# Patient Record
Sex: Male | Born: 1960 | Race: White | Hispanic: No | Marital: Married | Smoking: Never smoker
Health system: Southern US, Community
[De-identification: ages and names within clinical notes are randomized; demographics above are authoritative.]

## PROBLEM LIST (undated history)

## (undated) DIAGNOSIS — I10 Essential (primary) hypertension: Secondary | ICD-10-CM

## (undated) DIAGNOSIS — Z87442 Personal history of urinary calculi: Secondary | ICD-10-CM

## (undated) DIAGNOSIS — E7800 Pure hypercholesterolemia, unspecified: Secondary | ICD-10-CM

## (undated) DIAGNOSIS — E119 Type 2 diabetes mellitus without complications: Secondary | ICD-10-CM

## (undated) DIAGNOSIS — M109 Gout, unspecified: Secondary | ICD-10-CM

## (undated) DIAGNOSIS — N189 Chronic kidney disease, unspecified: Secondary | ICD-10-CM

## (undated) HISTORY — PX: PROSTATE BIOPSY: SHX241

## (undated) HISTORY — PX: COLONOSCOPY: SHX174

---
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
## 1986-05-09 HISTORY — PX: OTHER SURGICAL HISTORY: SHX169

## 2005-11-09 ENCOUNTER — Emergency Department (HOSPITAL_COMMUNITY): Admission: EM | Admit: 2005-11-09 | Discharge: 2005-11-09 | Payer: Self-pay | Admitting: Emergency Medicine

## 2014-04-01 ENCOUNTER — Encounter (HOSPITAL_COMMUNITY): Payer: Self-pay | Admitting: *Deleted

## 2014-04-01 ENCOUNTER — Emergency Department (HOSPITAL_COMMUNITY)
Admission: EM | Admit: 2014-04-01 | Discharge: 2014-04-02 | Disposition: A | Discharge status: Home / Self Care | Payer: BC Managed Care – PPO | Attending: Emergency Medicine | Admitting: Emergency Medicine

## 2014-04-01 ENCOUNTER — Emergency Department (HOSPITAL_COMMUNITY): Payer: BC Managed Care – PPO

## 2014-04-01 DIAGNOSIS — R109 Unspecified abdominal pain: Secondary | ICD-10-CM | POA: Diagnosis present

## 2014-04-01 DIAGNOSIS — N201 Calculus of ureter: Secondary | ICD-10-CM | POA: Insufficient documentation

## 2014-04-01 DIAGNOSIS — Z8739 Personal history of other diseases of the musculoskeletal system and connective tissue: Secondary | ICD-10-CM | POA: Diagnosis not present

## 2014-04-01 DIAGNOSIS — Z79899 Other long term (current) drug therapy: Secondary | ICD-10-CM | POA: Insufficient documentation

## 2014-04-01 HISTORY — DX: Gout, unspecified: M10.9

## 2014-04-01 MED ORDER — OXYCODONE-ACETAMINOPHEN 5-325 MG PO TABS: 1.0000 | ORAL_TABLET | ORAL | Status: DC | PRN

## 2014-04-01 MED ORDER — ONDANSETRON 4 MG PO TBDP: 4.0000 mg | ORAL_TABLET | Freq: Three times a day (TID) | ORAL | Status: DC | PRN

## 2014-04-01 MED ORDER — HYDROMORPHONE HCL 1 MG/ML IJ SOLN
1.0000 mg | Freq: Once | INTRAMUSCULAR | Status: AC
  Administered 2014-04-01: 1 mg via INTRAVENOUS
  Filled 2014-04-01: qty 1

## 2014-04-01 MED ORDER — KETOROLAC TROMETHAMINE 30 MG/ML IJ SOLN
30.0000 mg | Freq: Once | INTRAMUSCULAR | Status: AC
  Administered 2014-04-01: 30 mg via INTRAVENOUS
  Filled 2014-04-01: qty 1

## 2014-04-01 MED ORDER — IBUPROFEN 800 MG PO TABS: 800.0000 mg | ORAL_TABLET | Freq: Three times a day (TID) | ORAL | Status: DC

## 2014-04-01 MED ORDER — ONDANSETRON HCL 4 MG/2ML IJ SOLN
4.0000 mg | Freq: Once | INTRAMUSCULAR | Status: AC
  Administered 2014-04-01: 4 mg via INTRAVENOUS
  Filled 2014-04-01: qty 2

## 2014-04-01 MED ORDER — PROMETHAZINE HCL 25 MG PO TABS
25.0000 mg | ORAL_TABLET | Freq: Four times a day (QID) | ORAL | Status: DC | PRN
Start: 2014-04-01 — End: 2023-03-09

## 2014-04-01 MED ORDER — TAMSULOSIN HCL 0.4 MG PO CAPS: 0.4000 mg | ORAL_CAPSULE | Freq: Two times a day (BID) | ORAL | Status: DC

## 2014-04-01 NOTE — ED Provider Notes (Signed)
CSN: [id number]     Arrival date & time 04/01/14  2210 History   First MD Initiated Contact with Patient 04/01/14 2236     Chief Complaint  Patient presents with  . Flank Pain     (Consider location/radiation/quality/duration/timing/severity/associated sxs/prior Treatment) HPI Comments: The patient is a 53 year old male, he has no significant past medical history, who presents after having acute onset of pain in his right flank which occurred at approximately 7:00 this evening. This pain has been rather persistent that there is some fluctuation in the intensity of pain, it is regardless of the patient's position, he has no associated nausea or vomiting or diaphoresis and the pain does not radiate. The pain is a sharp and stabbing pain which was acute in onset. He denies a history of kidney stones and has had no recent dysuria, hematuria, fevers or chills. He took a Flexeril prior to arrival with no relief.  Patient is a 53 y.o. male presenting with flank pain. The history is provided by the patient and the spouse.  Flank Pain    Past Medical History  Diagnosis Date  . Gout    Past Surgical History  Procedure Laterality Date  . Tonsillectomy     No family history on file. History  Substance Use Topics  . Smoking status: Never Smoker   . Smokeless tobacco: Not on file  . Alcohol Use: No    Review of Systems  Genitourinary: Positive for flank pain.  All other systems reviewed and are negative.     Allergies  Review of patient's allergies indicates no known allergies.  Home Medications   Prior to Admission medications   Medication Sig Start Date End Date Taking? Authorizing Provider  ibuprofen (ADVIL,MOTRIN) 800 MG tablet Take 1 tablet (800 mg total) by mouth 3 (three) times daily. 04/01/14   Vida Roller, MD  ondansetron (ZOFRAN ODT) 4 MG disintegrating tablet Take 1 tablet (4 mg total) by mouth every 8 (eight) hours as needed for nausea. 04/01/14   Vida Roller, MD   oxyCODONE-acetaminophen (PERCOCET) 5-325 MG per tablet Take 1 tablet by mouth every 4 (four) hours as needed. 04/01/14   Vida Roller, MD  promethazine (PHENERGAN) 25 MG tablet Take 1 tablet (25 mg total) by mouth every 6 (six) hours as needed for nausea or vomiting. 04/01/14   Vida Roller, MD  tamsulosin (FLOMAX) 0.4 MG CAPS capsule Take 1 capsule (0.4 mg total) by mouth 2 (two) times daily. 04/01/14   Vida Roller, MD   BP 157/78 mmHg  Pulse 61  Temp(Src) 98 F (36.7 C) (Oral)  Resp 22  Ht 6\' 1"  (1.854 m)  Wt 235 lb (106.595 kg)  BMI 31.01 kg/m2  SpO2 96% Physical Exam  Constitutional: He appears well-developed and well-nourished. No distress.  HENT:  Head: Normocephalic and atraumatic.  Mouth/Throat: Oropharynx is clear and moist. No oropharyngeal exudate.  Eyes: Conjunctivae and EOM are normal. Pupils are equal, round, and reactive to light. Right eye exhibits no discharge. Left eye exhibits no discharge. No scleral icterus.  Neck: Normal range of motion. Neck supple. No JVD present. No thyromegaly present.  Cardiovascular: Normal rate, regular rhythm, normal heart sounds and intact distal pulses.  Exam reveals no gallop and no friction rub.   No murmur heard. Pulmonary/Chest: Effort normal and breath sounds normal. No respiratory distress. He has no wheezes. He has no rales.  Abdominal: Soft. Bowel sounds are normal. He exhibits no distension and no mass. There  is no tenderness.  R CVA ttp  Musculoskeletal: Normal range of motion. He exhibits no edema or tenderness.  Lymphadenopathy:    He has no cervical adenopathy.  Neurological: He is alert. Coordination normal.  Skin: Skin is warm and dry. No rash noted. No erythema.  Psychiatric: He has a normal mood and affect. His behavior is normal.  Nursing note and vitals reviewed.   ED Course  Procedures (including critical care time) Labs Review Labs Reviewed  URINALYSIS, ROUTINE W REFLEX MICROSCOPIC    Imaging  Review Ct Abdomen Pelvis Wo Contrast  04/01/2014   CLINICAL DATA:  Acute onset LEFT flank pain beginning today.  EXAM: CT ABDOMEN AND PELVIS WITHOUT CONTRAST  TECHNIQUE: Multidetector CT imaging of the abdomen and pelvis was performed following the standard protocol without IV contrast.  COMPARISON:  None.  FINDINGS: LUNG BASES: Included view of the lung bases are clear. The visualized heart and pericardium are unremarkable.  KIDNEYS/BLADDER: Kidneys are orthotopic, demonstrating normal size and morphology. Mild RIGHT hydroureteronephrosis to the level of the proximal ureter where a 5 mm calculus is seen. Residual 3 mm RIGHT lower pole, 4 mm RIGHT lower pole, 3 mm RIGHT lower pole nephrolithiasis. Punctate LEFT lower pole nephrolithiasis without hydronephrosis. The urinary bladder is partially distended, harboring no intravesicular calculi.  SOLID ORGANS: The spleen, gallbladder, pancreas and adrenal glands are unremarkable for this non-contrast examination. Mild hepatic steatosis.  GASTROINTESTINAL TRACT: The stomach, small and large bowel are normal in course and caliber without inflammatory changes, the sensitivity may be decreased by lack of enteric contrast. Mild sigmoid diverticulosis. Subcentimeter focal outpouching at the cecum may reflect diverticulum or appendix without inflammation.  PERITONEUM/RETROPERITONEUM: No intraperitoneal free fluid nor free air. Aortoiliac vessels are normal in course and caliber, mild calcific atherosclerosis. No lymphadenopathy by CT size criteria. Prostate is 5.3 x 4.4 cm, deforming the base of the urinary bladder.  SOFT TISSUES/ OSSEOUS STRUCTURES: Nonsuspicious. Moderate sacroiliac osteoarthrosis. Moderate bilateral fat containing inguinal hernias.  IMPRESSION: Mild RIGHT hydroureteronephrosis to the level the proximal ureter where a 5 mm calculus is seen.  Bilateral nephrolithiasis measuring up to 4 mm. No LEFT obstructive uropathy.   Electronically Signed   By: Awilda Metroourtnay   Bloomer   On: 04/01/2014 23:41      MDM   Final diagnoses:  Flank pain  Ureterolithiasis    On exam the patient has CVA tenderness, on bedside ultrasound he has mild hydronephrosis, we'll proceed with CT scan to evaluate size of the kidney stone. Medications will be given as below.  Emergency Focused Ultrasound Exam Limited retroperitoneal ultrasound of kidneys  Performed and interpreted by Dr. Hyacinth MeekerMiller Indication: flank pain Focused abdominal ultrasound with both kidneys imaged in transverse and longitudinal planes in real-time. Interpretation:  hydronephrosis visualized on the R Images archived electronically  The CT scan shows that the pt has a 5.5 mm KS, strainer given, pain meds with significant improvement, VS stable - pt instructed on indications for return, expressed understanding.   Meds given in ED:  Medications  ketorolac (TORADOL) 30 MG/ML injection 30 mg (30 mg Intravenous Given 04/01/14 2253)  HYDROmorphone (DILAUDID) injection 1 mg (1 mg Intravenous Given 04/01/14 2301)  ondansetron (ZOFRAN) injection 4 mg (4 mg Intravenous Given 04/01/14 2253)    New Prescriptions   IBUPROFEN (ADVIL,MOTRIN) 800 MG TABLET    Take 1 tablet (800 mg total) by mouth 3 (three) times daily.   ONDANSETRON (ZOFRAN ODT) 4 MG DISINTEGRATING TABLET    Take 1 tablet (4  mg total) by mouth every 8 (eight) hours as needed for nausea.   OXYCODONE-ACETAMINOPHEN (PERCOCET) 5-325 MG PER TABLET    Take 1 tablet by mouth every 4 (four) hours as needed.   PROMETHAZINE (PHENERGAN) 25 MG TABLET    Take 1 tablet (25 mg total) by mouth every 6 (six) hours as needed for nausea or vomiting.   TAMSULOSIN (FLOMAX) 0.4 MG CAPS CAPSULE    Take 1 capsule (0.4 mg total) by mouth 2 (two) times daily.      Vida RollerBrian D Harrol Novello, MD 04/02/14 0000

## 2014-04-01 NOTE — ED Notes (Signed)
Pt reports R flank pain  With n/v since 1900.  No hx of kidney stones

## 2014-04-01 NOTE — ED Notes (Signed)
Patient unable to urinate at this time. 

## 2014-04-02 LAB — URINALYSIS, ROUTINE W REFLEX MICROSCOPIC
BILIRUBIN URINE: NEGATIVE
GLUCOSE, UA: NEGATIVE mg/dL
KETONES UR: NEGATIVE mg/dL
Leukocytes, UA: NEGATIVE
NITRITE: NEGATIVE
PH: 7 (ref 5.0–8.0)
Protein, ur: 30 mg/dL — AB
SPECIFIC GRAVITY, URINE: 1.019 (ref 1.005–1.030)
Urobilinogen, UA: 0.2 mg/dL (ref 0.0–1.0)

## 2014-04-02 LAB — URINE MICROSCOPIC-ADD ON

## 2014-04-08 DIAGNOSIS — Z87442 Personal history of urinary calculi: Secondary | ICD-10-CM

## 2014-04-08 HISTORY — DX: Personal history of urinary calculi: Z87.442

## 2014-04-25 ENCOUNTER — Other Ambulatory Visit: Payer: Self-pay | Admitting: Urology

## 2014-04-28 ENCOUNTER — Encounter (HOSPITAL_COMMUNITY): Payer: Self-pay | Admitting: *Deleted

## 2014-05-01 ENCOUNTER — Encounter (HOSPITAL_COMMUNITY): Payer: Self-pay | Admitting: *Deleted

## 2014-05-01 ENCOUNTER — Encounter (HOSPITAL_COMMUNITY)
Admission: RE | Disposition: A | Discharge status: Home / Self Care | Payer: Self-pay | Source: Ambulatory Visit | Attending: Urology

## 2014-05-01 ENCOUNTER — Ambulatory Visit (HOSPITAL_COMMUNITY): Payer: BC Managed Care – PPO

## 2014-05-01 ENCOUNTER — Ambulatory Visit (HOSPITAL_COMMUNITY)
Admission: RE | Admit: 2014-05-01 | Discharge: 2014-05-01 | Disposition: A | Discharge status: Home / Self Care | Payer: BC Managed Care – PPO | Source: Ambulatory Visit | Attending: Urology | Admitting: Urology

## 2014-05-01 DIAGNOSIS — N201 Calculus of ureter: Secondary | ICD-10-CM | POA: Diagnosis not present

## 2014-05-01 DIAGNOSIS — E780 Pure hypercholesterolemia: Secondary | ICD-10-CM | POA: Diagnosis not present

## 2014-05-01 DIAGNOSIS — J45909 Unspecified asthma, uncomplicated: Secondary | ICD-10-CM | POA: Diagnosis not present

## 2014-05-01 DIAGNOSIS — M109 Gout, unspecified: Secondary | ICD-10-CM | POA: Diagnosis not present

## 2014-05-01 DIAGNOSIS — I10 Essential (primary) hypertension: Secondary | ICD-10-CM | POA: Insufficient documentation

## 2014-05-01 HISTORY — DX: Personal history of urinary calculi: Z87.442

## 2014-05-01 SURGERY — LITHOTRIPSY, ESWL
Anesthesia: LOCAL | Laterality: Right

## 2014-05-01 MED ORDER — DIPHENHYDRAMINE HCL 25 MG PO CAPS
25.0000 mg | ORAL_CAPSULE | ORAL | Status: AC
  Administered 2014-05-01: 25 mg via ORAL
  Filled 2014-05-01: qty 1

## 2014-05-01 MED ORDER — DEXTROSE-NACL 5-0.45 % IV SOLN
INTRAVENOUS | Status: DC
  Administered 2014-05-01: 1000 mL via INTRAVENOUS

## 2014-05-01 MED ORDER — DIAZEPAM 5 MG PO TABS
10.0000 mg | ORAL_TABLET | ORAL | Status: AC
  Administered 2014-05-01: 10 mg via ORAL
  Filled 2014-05-01: qty 2

## 2014-05-01 MED ORDER — CIPROFLOXACIN HCL 500 MG PO TABS
500.0000 mg | ORAL_TABLET | ORAL | Status: AC
  Administered 2014-05-01: 500 mg via ORAL
  Filled 2014-05-01: qty 1

## 2014-05-01 NOTE — Op Note (Signed)
Refer to [facility] Op Note scanned in the chart 

## 2014-05-01 NOTE — H&P (Signed)
History of Present Illness Sergio Davis returns today for follow-up management of kidney stone. He had moderate to severe pain on and off till last Saturday. He has not had any since. He has not passed a stone either. KUB today shows the stone in the same location at the transverse process of L4.   Past Medical History Problems  1. History of Asthma (J45.909) 2. History of gout (Z87.39) 3. History of hypercholesterolemia (Z86.39) 4. History of hypertension (Z86.79)  Surgical History Problems  1. History of Surgery Testis Incision 2. History of Tonsillectomy  Current Meds 1. Ibuprofen 800 MG Oral Tablet;  Therapy: (Recorded:01Dec2015) to Recorded 2. Oxycodone-Acetaminophen 5-325 MG Oral Tablet;  Therapy: (Recorded:01Dec2015) to Recorded 3. Tamsulosin HCl - 0.4 MG Oral Capsule; TAKE 1 CAPSULE Bedtime;  Therapy: 01Dec2015 to (Last Rx:01Dec2015) Ordered 4. Tamsulosin HCl - 0.4 MG Oral Capsule;  Therapy: (Recorded:01Dec2015) to Recorded  Allergies Medication  1. No Known Drug Allergies  Family History Problems  1. Family history of Colon Cancer 2. Family history of Dementia : Father 3. Family history of Family Health Status - Father's Age : Mother   47 4. Family history of Family Health Status - Mother's Age : Mother   67 5. Family history of Family Health Status Number Of Children : Mother   1 son / 2 daug -Adopted  Social History Problems  1. Denied: History of Alcohol Use 2. Caffeine Use   4 to 6 cups coffee a day 3. Marital History - Currently Married 4. Never a smoker 5. Denied: History of Tobacco Use  Review of Systems Genitourinary, constitutional, skin, eye, otolaryngeal, hematologic/lymphatic, cardiovascular, pulmonary, endocrine, musculoskeletal, gastrointestinal, neurological and psychiatric system(s) were reviewed and pertinent findings if present are noted and are otherwise negative.  Genitourinary: urinary frequency, feelings of urinary urgency, nocturia  and weak urinary stream.  Gastrointestinal: nausea, vomiting, flank pain, heartburn and constipation.  Constitutional: feeling tired (fatigue).    Physical Exam Constitutional: Well nourished and well developed . No acute distress.  ENT:. The ears and nose are normal in appearance.  Neck: The appearance of the neck is normal and no neck mass is present.  Pulmonary: No respiratory distress and normal respiratory rhythm and effort.  Cardiovascular: Heart rate and rhythm are normal . No peripheral edema.  Abdomen: The abdomen is soft and nontender. No masses are palpated. No CVA tenderness. No hernias are palpable. No hepatosplenomegaly noted.  Genitourinary: Examination of the penis demonstrates no discharge, no masses, no lesions and a normal meatus. The scrotum is without lesions. The right epididymis is palpably normal and non-tender. The left epididymis is palpably normal and non-tender. The right testis is non-tender and without masses. The left testis is non-tender and without masses.  Lymphatics: The femoral and inguinal nodes are not enlarged or tender.  Skin: Normal skin turgor, no visible rash and no visible skin lesions.  Neuro/Psych:. Mood and affect are appropriate.    Results/Data Urine [Data Includes: Last 1 Day]   17Dec2015  COLOR YELLOW   APPEARANCE CLOUDY   SPECIFIC GRAVITY 1.020   pH 7.0   GLUCOSE NEG mg/dL  BILIRUBIN NEG   KETONE NEG mg/dL  BLOOD SMALL   PROTEIN NEG mg/dL  UROBILINOGEN 0.2 mg/dL  NITRITE NEG   LEUKOCYTE ESTERASE NEG   SQUAMOUS EPITHELIAL/HPF NONE SEEN   WBC NONE SEEN WBC/hpf  RBC NONE SEEN RBC/hpf  BACTERIA NONE SEEN   CRYSTALS NONE SEEN   CASTS NONE SEEN   Other MODERATE AMORPHOUS MATERIAL  KUB  INDICATION: Follow-up right ureteral stone  KUB shows normal bowel gas pattern. Psoas shadows are normal. There is a 5 mm calcification at the transverse process of L4. It has not changed in position since X Ray of 2 weeks ago.   IMPRESSION:  Right proximal ureteral calculus. Unchanged since last KUB.   Assessment Assessed  1. Calculus of right ureter (N20.1)  Plan Calculus of right ureter  1. KUB; Status:Resulted - Requires Verification;   Done: 17Dec2015 12:00AM Health Maintenance  2. UA With REFLEX; [Do Not Release]; Status:Resulted - Requires Verification;   Done:  17Dec2015 02:58PM  Treatment options were discussed with the patient and his wife. They are: ESL versus ureteroscopy with holmium laser. The risks, benefits of each option were reviewed. The risks of ureteroscopy include but are not limited to hemorrhage, infection, ureteral injury, inability to find the stone.  The risks of ESL are: hemorrhage, renal or perirenal hematoma, injury to adjacent organs, inability to fragment the stone, steinstrasse. They understand and wish to proceed with ESL.  History of Present Illness Sergio Davis returns today for follow-up management of kidney stone. He had moderate to severe pain on and off till last Saturday. He has not had any since. He has not passed a stone either. KUB today shows the stone in the same location at the transverse process of L4.   Past Medical History Problems  1. History of Asthma (J45.909) 2. History of gout (Z87.39) 3. History of hypercholesterolemia (Z86.39) 4. History of hypertension (Z86.79)  Surgical History Problems  1. History of Surgery Testis Incision 2. History of Tonsillectomy  Current Meds 1. Ibuprofen 800 MG Oral Tablet;  Therapy: (Recorded:01Dec2015) to Recorded 2. Oxycodone-Acetaminophen 5-325 MG Oral Tablet;  Therapy: (Recorded:01Dec2015) to Recorded 3. Tamsulosin HCl - 0.4 MG Oral Capsule; TAKE 1 CAPSULE Bedtime;  Therapy: 01Dec2015 to (Last Rx:01Dec2015) Ordered 4. Tamsulosin HCl - 0.4 MG Oral Capsule;  Therapy: (Recorded:01Dec2015) to Recorded  Allergies Medication  1. No Known Drug Allergies  Family History Problems  1. Family history of Colon Cancer 2. Family history  of Dementia : Father 3. Family history of Family Health Status - Father's Age : Mother   33 4. Family history of Family Health Status - Mother's Age : Mother   64 5. Family history of Family Health Status Number Of Children : Mother   1 son / 2 daug -Adopted  Social History Problems  1. Denied: History of Alcohol Use 2. Caffeine Use   4 to 6 cups coffee a day 3. Marital History - Currently Married 4. Never a smoker 5. Denied: History of Tobacco Use  Review of Systems Genitourinary, constitutional, skin, eye, otolaryngeal, hematologic/lymphatic, cardiovascular, pulmonary, endocrine, musculoskeletal, gastrointestinal, neurological and psychiatric system(s) were reviewed and pertinent findings if present are noted and are otherwise negative.  Genitourinary: urinary frequency, feelings of urinary urgency, nocturia and weak urinary stream.  Gastrointestinal: nausea, vomiting, flank pain, heartburn and constipation.  Constitutional: feeling tired (fatigue).    Physical Exam Constitutional: Well nourished and well developed . No acute distress.  ENT:. The ears and nose are normal in appearance.  Neck: The appearance of the neck is normal and no neck mass is present.  Pulmonary: No respiratory distress and normal respiratory rhythm and effort.  Cardiovascular: Heart rate and rhythm are normal . No peripheral edema.  Abdomen: The abdomen is soft and nontender. No masses are palpated. No CVA tenderness. No hernias are palpable. No hepatosplenomegaly noted.  Genitourinary: Examination of the penis  demonstrates no discharge, no masses, no lesions and a normal meatus. The scrotum is without lesions. The right epididymis is palpably normal and non-tender. The left epididymis is palpably normal and non-tender. The right testis is non-tender and without masses. The left testis is non-tender and without masses.  Lymphatics: The femoral and inguinal nodes are not enlarged or tender.  Skin: Normal  skin turgor, no visible rash and no visible skin lesions.  Neuro/Psych:. Mood and affect are appropriate.    Results/Data Urine [Data Includes: Last 1 Day]   17Dec2015  COLOR YELLOW   APPEARANCE CLOUDY   SPECIFIC GRAVITY 1.020   pH 7.0   GLUCOSE NEG mg/dL  BILIRUBIN NEG   KETONE NEG mg/dL  BLOOD SMALL   PROTEIN NEG mg/dL  UROBILINOGEN 0.2 mg/dL  NITRITE NEG   LEUKOCYTE ESTERASE NEG   SQUAMOUS EPITHELIAL/HPF NONE SEEN   WBC NONE SEEN WBC/hpf  RBC NONE SEEN RBC/hpf  BACTERIA NONE SEEN   CRYSTALS NONE SEEN   CASTS NONE SEEN   Other MODERATE AMORPHOUS MATERIAL    KUB  INDICATION: Follow-up right ureteral stone  KUB shows normal bowel gas pattern. Psoas shadows are normal. There is a 5 mm calcification at the transverse process of L4. It has not changed in position since X Ray of 2 weeks ago.   IMPRESSION: Right proximal ureteral calculus. Unchanged since last KUB.   Assessment Assessed  1. Calculus of right ureter (N20.1)  Plan Calculus of right ureter  1. KUB; Status:Resulted - Requires Verification;   Done: 17Dec2015 12:00AM Health Maintenance  2. UA With REFLEX; [Do Not Release]; Status:Resulted - Requires Verification;   Done:  17Dec2015 02:58PM  Treatment options were discussed with the patient and his wife. They are: ESL versus ureteroscopy with holmium laser. The risks, benefits of each option were reviewed. The risks of ureteroscopy include but are not limited to hemorrhage, infection, ureteral injury, inability to find the stone.  The risks of ESL are: hemorrhage, renal or perirenal hematoma, injury to adjacent organs, inability to fragment the stone, steinstrasse. They understand and wish to proceed with ESL.  History of Present Illness Sergio Davis returns today for follow-up management of kidney stone. He had moderate to severe pain on and off till last Saturday. He has not had any since. He has not passed a stone either. KUB today shows the stone in the same  location at the transverse process of L4.   Past Medical History Problems  1. History of Asthma (J45.909) 2. History of gout (Z87.39) 3. History of hypercholesterolemia (Z86.39) 4. History of hypertension (Z86.79)  Surgical History Problems  1. History of Surgery Testis Incision 2. History of Tonsillectomy  Current Meds 1. Ibuprofen 800 MG Oral Tablet;  Therapy: (Recorded:01Dec2015) to Recorded 2. Oxycodone-Acetaminophen 5-325 MG Oral Tablet;  Therapy: (Recorded:01Dec2015) to Recorded 3. Tamsulosin HCl - 0.4 MG Oral Capsule; TAKE 1 CAPSULE Bedtime;  Therapy: 01Dec2015 to (Last Rx:01Dec2015) Ordered 4. Tamsulosin HCl - 0.4 MG Oral Capsule;  Therapy: (Recorded:01Dec2015) to Recorded  Allergies Medication  1. No Known Drug Allergies  Family History Problems  1. Family history of Colon Cancer 2. Family history of Dementia : Father 3. Family history of Family Health Status - Father's Age : Mother   6179 4. Family history of Family Health Status - Mother's Age : Mother   3777 5. Family history of Family Health Status Number Of Children : Mother   1 son / 2 daug -Adopted  Social History Problems  1. Denied:  History of Alcohol Use 2. Caffeine Use   4 to 6 cups coffee a day 3. Marital History - Currently Married 4. Never a smoker 5. Denied: History of Tobacco Use  Review of Systems Genitourinary, constitutional, skin, eye, otolaryngeal, hematologic/lymphatic, cardiovascular, pulmonary, endocrine, musculoskeletal, gastrointestinal, neurological and psychiatric system(s) were reviewed and pertinent findings if present are noted and are otherwise negative.  Genitourinary: urinary frequency, feelings of urinary urgency, nocturia and weak urinary stream.  Gastrointestinal: nausea, vomiting, flank pain, heartburn and constipation.  Constitutional: feeling tired (fatigue).    Physical Exam Constitutional: Well nourished and well developed . No acute distress.  ENT:. The ears  and nose are normal in appearance.  Neck: The appearance of the neck is normal and no neck mass is present.  Pulmonary: No respiratory distress and normal respiratory rhythm and effort.  Cardiovascular: Heart rate and rhythm are normal . No peripheral edema.  Abdomen: The abdomen is soft and nontender. No masses are palpated. No CVA tenderness. No hernias are palpable. No hepatosplenomegaly noted.  Genitourinary: Examination of the penis demonstrates no discharge, no masses, no lesions and a normal meatus. The scrotum is without lesions. The right epididymis is palpably normal and non-tender. The left epididymis is palpably normal and non-tender. The right testis is non-tender and without masses. The left testis is non-tender and without masses.  Lymphatics: The femoral and inguinal nodes are not enlarged or tender.  Skin: Normal skin turgor, no visible rash and no visible skin lesions.  Neuro/Psych:. Mood and affect are appropriate.    Results/Data Urine [Data Includes: Last 1 Day]   17Dec2015  COLOR YELLOW   APPEARANCE CLOUDY   SPECIFIC GRAVITY 1.020   pH 7.0   GLUCOSE NEG mg/dL  BILIRUBIN NEG   KETONE NEG mg/dL  BLOOD SMALL   PROTEIN NEG mg/dL  UROBILINOGEN 0.2 mg/dL  NITRITE NEG   LEUKOCYTE ESTERASE NEG   SQUAMOUS EPITHELIAL/HPF NONE SEEN   WBC NONE SEEN WBC/hpf  RBC NONE SEEN RBC/hpf  BACTERIA NONE SEEN   CRYSTALS NONE SEEN   CASTS NONE SEEN   Other MODERATE AMORPHOUS MATERIAL    KUB  INDICATION: Follow-up right ureteral stone  KUB shows normal bowel gas pattern. Psoas shadows are normal. There is a 5 mm calcification at the transverse process of L4. It has not changed in position since X Ray of 2 weeks ago.   IMPRESSION: Right proximal ureteral calculus. Unchanged since last KUB.   Assessment Assessed  1. Calculus of right ureter (N20.1)  Plan Calculus of right ureter  1. KUB; Status:Resulted - Requires Verification;   Done: 17Dec2015 12:00AM Health Maintenance   2. UA With REFLEX; [Do Not Release]; Status:Resulted - Requires Verification;   Done:  17Dec2015 02:58PM  Treatment options were discussed with the patient and his wife. They are: ESL versus ureteroscopy with holmium laser. The risks, benefits of each option were reviewed. The risks of ureteroscopy include but are not limited to hemorrhage, infection, ureteral injury, inability to find the stone.  The risks of ESL are: hemorrhage, renal or perirenal hematoma, injury to adjacent organs, inability to fragment the stone, steinstrasse. They understand and wish to proceed with ESL.  History of Present Illness Sergio Davis returns today for follow-up management of kidney stone. He had moderate to severe pain on and off till last Saturday. He has not had any since. He has not passed a stone either. KUB today shows the stone in the same location at the transverse process of L4.  Past Medical History Problems  1. History of Asthma (J45.909) 2. History of gout (Z87.39) 3. History of hypercholesterolemia (Z86.39) 4. History of hypertension (Z86.79)  Surgical History Problems  1. History of Surgery Testis Incision 2. History of Tonsillectomy  Current Meds 1. Ibuprofen 800 MG Oral Tablet;  Therapy: (Recorded:01Dec2015) to Recorded 2. Oxycodone-Acetaminophen 5-325 MG Oral Tablet;  Therapy: (Recorded:01Dec2015) to Recorded 3. Tamsulosin HCl - 0.4 MG Oral Capsule; TAKE 1 CAPSULE Bedtime;  Therapy: 01Dec2015 to (Last Rx:01Dec2015) Ordered 4. Tamsulosin HCl - 0.4 MG Oral Capsule;  Therapy: (Recorded:01Dec2015) to Recorded  Allergies Medication  1. No Known Drug Allergies  Family History Problems  1. Family history of Colon Cancer 2. Family history of Dementia : Father 3. Family history of Family Health Status - Father's Age : Mother   4779 4. Family history of Family Health Status - Mother's Age : Mother   2977 5. Family history of Family Health Status Number Of Children : Mother   1 son / 2  daug -Adopted  Social History Problems  1. Denied: History of Alcohol Use 2. Caffeine Use   4 to 6 cups coffee a day 3. Marital History - Currently Married 4. Never a smoker 5. Denied: History of Tobacco Use  Review of Systems Genitourinary, constitutional, skin, eye, otolaryngeal, hematologic/lymphatic, cardiovascular, pulmonary, endocrine, musculoskeletal, gastrointestinal, neurological and psychiatric system(s) were reviewed and pertinent findings if present are noted and are otherwise negative.  Genitourinary: urinary frequency, feelings of urinary urgency, nocturia and weak urinary stream.  Gastrointestinal: nausea, vomiting, flank pain, heartburn and constipation.  Constitutional: feeling tired (fatigue).    Physical Exam Constitutional: Well nourished and well developed . No acute distress.  ENT:. The ears and nose are normal in appearance.  Neck: The appearance of the neck is normal and no neck mass is present.  Pulmonary: No respiratory distress and normal respiratory rhythm and effort.  Cardiovascular: Heart rate and rhythm are normal . No peripheral edema.  Abdomen: The abdomen is soft and nontender. No masses are palpated. No CVA tenderness. No hernias are palpable. No hepatosplenomegaly noted.  Genitourinary: Examination of the penis demonstrates no discharge, no masses, no lesions and a normal meatus. The scrotum is without lesions. The right epididymis is palpably normal and non-tender. The left epididymis is palpably normal and non-tender. The right testis is non-tender and without masses. The left testis is non-tender and without masses.  Lymphatics: The femoral and inguinal nodes are not enlarged or tender.  Skin: Normal skin turgor, no visible rash and no visible skin lesions.  Neuro/Psych:. Mood and affect are appropriate.    Results/Data Urine [Data Includes: Last 1 Day]   17Dec2015  COLOR YELLOW   APPEARANCE CLOUDY   SPECIFIC GRAVITY 1.020   pH 7.0   GLUCOSE  NEG mg/dL  BILIRUBIN NEG   KETONE NEG mg/dL  BLOOD SMALL   PROTEIN NEG mg/dL  UROBILINOGEN 0.2 mg/dL  NITRITE NEG   LEUKOCYTE ESTERASE NEG   SQUAMOUS EPITHELIAL/HPF NONE SEEN   WBC NONE SEEN WBC/hpf  RBC NONE SEEN RBC/hpf  BACTERIA NONE SEEN   CRYSTALS NONE SEEN   CASTS NONE SEEN   Other MODERATE AMORPHOUS MATERIAL    KUB  INDICATION: Follow-up right ureteral stone  KUB shows normal bowel gas pattern. Psoas shadows are normal. There is a 5 mm calcification at the transverse process of L4. It has not changed in position since X Ray of 2 weeks ago.   IMPRESSION: Right proximal ureteral calculus. Unchanged since last  KUB.   Assessment Assessed  1. Calculus of right ureter (N20.1)  Plan Calculus of right ureter  1. KUB; Status:Resulted - Requires Verification;   Done: 17Dec2015 12:00AM Health Maintenance  2. UA With REFLEX; [Do Not Release]; Status:Resulted - Requires Verification;   Done:  17Dec2015 02:58PM  Treatment options were discussed with the patient and his wife. They are: ESL versus ureteroscopy with holmium laser. The risks, benefits of each option were reviewed. The risks of ureteroscopy include but are not limited to hemorrhage, infection, ureteral injury, inability to find the stone.  The risks of ESL are: hemorrhage, renal or perirenal hematoma, injury to adjacent organs, inability to fragment the stone, steinstrasse. They understand and wish to proceed with ESL.

## 2014-05-01 NOTE — Discharge Instructions (Signed)
Lithotripsy, Care After °Refer to this sheet in the next few weeks. These instructions provide you with information on caring for yourself after your procedure. Your health care provider may also give you more specific instructions. Your treatment has been planned according to current medical practices, but problems sometimes occur. Call your health care provider if you have any problems or questions after your procedure. °WHAT TO EXPECT AFTER THE PROCEDURE  °· Your urine may have a red tinge for a few days after treatment. Blood loss is usually minimal. °· You may have soreness in the back or flank area. This usually goes away after a few days. The procedure can cause blotches or bruises on the back where the pressure wave enters the skin. These marks usually cause only minimal discomfort and should disappear in a short time. °· Stone fragments should begin to pass within 24 hours of treatment. However, a delayed passage is not unusual. °· You may have pain, discomfort, and feel sick to your stomach (nauseated) when the crushed fragments of stone are passed down the tube from the kidney to the bladder. Stone fragments can pass soon after the procedure and may last for up to 4-8 weeks. °· A small number of patients may have severe pain when stone fragments are not able to pass, which leads to an obstruction. °· If your stone is greater than 1 inch (2.5 cm) in diameter or if you have multiple stones that have a combined diameter greater than 1 inch (2.5 cm), you may require more than one treatment. °· If you had a stent placed prior to your procedure, you may experience some discomfort, especially during urination. You may experience the pain or discomfort in your flank or back, or you may experience a sharp pain or discomfort at the base of your penis or in your lower abdomen. The discomfort usually lasts only a few minutes after urinating. °HOME CARE INSTRUCTIONS  °· Rest at home until you feel your energy  improving. °· Only take over-the-counter or prescription medicines for pain, discomfort, or fever as directed by your health care provider. Depending on the type of lithotripsy, you may need to take antibiotics and anti-inflammatory medicines for a few days. °· Drink enough water and fluids to keep your urine clear or pale yellow. This helps "flush" your kidneys. It helps pass any remaining pieces of stone and prevents stones from coming back. °· Most people can resume daily activities within 1-2 days after standard lithotripsy. It can take longer to recover from laser and percutaneous lithotripsy. °· If the stones are in your urinary system, you may be asked to strain your urine at home to look for stones. Any stones that are found can be sent to a medical lab for examination. °· Visit your health care provider for a follow-up appointment in a few weeks. Your doctor may remove your stent if you have one. Your health care provider will also check to see whether stone particles still remain. °SEEK MEDICAL CARE IF:  °· Your pain is not relieved by medicine. °· You have a lasting nauseous feeling. °· You feel there is too much blood in the urine. °· You develop persistent problems with frequent or painful urination that does not at least partially improve after 2 days following the procedure. °· You have a congested cough. °· You feel lightheaded. °· You develop a rash or any other signs that might suggest an allergic problem. °· You develop any reaction or side effects to   your medicine(s). °SEEK IMMEDIATE MEDICAL CARE IF:  °· You experience severe back or flank pain or both. °· You see nothing but blood when you urinate. °· You cannot pass any urine at all. °· You have a fever or shaking chills. °· You develop shortness of breath, difficulty breathing, or chest pain. °· You develop vomiting that will not stop after 6-8 hours. °· You have a fainting episode. °Document Released: 05/15/2007 Document Revised: 02/13/2013  Document Reviewed: 11/08/2012 °ExitCare® Patient Information ©2015 [employer]. This information is not intended to replace advice given to you by your health care provider. Make sure you discuss any questions you have with your health care provider. ° ° ° °Conscious Sedation, Adult, Care After °Refer to this sheet in the next few weeks. These instructions provide you with information on caring for yourself after your procedure. Your health care provider may also give you more specific instructions. Your treatment has been planned according to current medical practices, but problems sometimes occur. Call your health care provider if you have any problems or questions after your procedure. °WHAT TO EXPECT AFTER THE PROCEDURE  °After your procedure: °· You may feel sleepy, clumsy, and have poor balance for several hours. °· Vomiting may occur if you eat too soon after the procedure. °HOME CARE INSTRUCTIONS °· Do not participate in any activities where you could become injured for at least 24 hours. Do not: °¨ Drive. °¨ Swim. °¨ Ride a bicycle. °¨ Operate heavy machinery. °¨ Cook. °¨ Use power tools. °¨ Climb ladders. °¨ Work from a high place. °· Do not make important decisions or sign legal documents until you are improved. °· If you vomit, drink water, juice, or soup when you can drink without vomiting. Make sure you have little or no nausea before eating solid foods. °· Only take over-the-counter or prescription medicines for pain, discomfort, or fever as directed by your health care provider. °· Make sure you and your family fully understand everything about the medicines given to you, including what side effects may occur. °· You should not drink alcohol, take sleeping pills, or take medicines that cause drowsiness for at least 24 hours. °· If you smoke, do not smoke without supervision. °· If you are feeling better, you may resume normal activities 24 hours after you were sedated. °· Keep all appointments with  your health care provider. °SEEK MEDICAL CARE IF: °· Your skin is pale or bluish in color. °· You continue to feel nauseous or vomit. °· Your pain is getting worse and is not helped by medicine. °· You have bleeding or swelling. °· You are still sleepy or feeling clumsy after 24 hours. °SEEK IMMEDIATE MEDICAL CARE IF: °· You develop a rash. °· You have difficulty breathing. °· You develop any type of allergic problem. °· You have a fever. °MAKE SURE YOU: °· Understand these instructions. °· Will watch your condition. °· Will get help right away if you are not doing well or get worse. °Document Released: 02/13/2013 Document Reviewed: 02/13/2013 °ExitCare® Patient Information ©2015 [employer]. This information is not intended to replace advice given to you by your health care provider. Make sure you discuss any questions you have with your health care provider. ° °

## 2014-05-01 NOTE — Progress Notes (Signed)
Right flank area pink/red in color, skin dry and intact.

## 2015-07-07 ENCOUNTER — Other Ambulatory Visit: Payer: Self-pay | Admitting: Urology

## 2015-07-17 ENCOUNTER — Encounter (HOSPITAL_COMMUNITY): Payer: Self-pay | Admitting: General Practice

## 2015-07-20 ENCOUNTER — Encounter (HOSPITAL_COMMUNITY)
Admission: RE | Disposition: A | Discharge status: Home / Self Care | Payer: Self-pay | Source: Ambulatory Visit | Attending: Urology

## 2015-07-20 ENCOUNTER — Ambulatory Visit (HOSPITAL_COMMUNITY): Payer: BLUE CROSS/BLUE SHIELD

## 2015-07-20 ENCOUNTER — Encounter (HOSPITAL_COMMUNITY): Payer: Self-pay

## 2015-07-20 ENCOUNTER — Ambulatory Visit (HOSPITAL_COMMUNITY)
Admission: RE | Admit: 2015-07-20 | Discharge: 2015-07-20 | Disposition: A | Discharge status: Home / Self Care | Payer: BLUE CROSS/BLUE SHIELD | Source: Ambulatory Visit | Attending: Urology | Admitting: Urology

## 2015-07-20 DIAGNOSIS — Z6832 Body mass index (BMI) 32.0-32.9, adult: Secondary | ICD-10-CM | POA: Diagnosis not present

## 2015-07-20 DIAGNOSIS — N200 Calculus of kidney: Secondary | ICD-10-CM | POA: Diagnosis present

## 2015-07-20 DIAGNOSIS — E669 Obesity, unspecified: Secondary | ICD-10-CM | POA: Insufficient documentation

## 2015-07-20 HISTORY — DX: Chronic kidney disease, unspecified: N18.9

## 2015-07-20 SURGERY — LITHOTRIPSY, ESWL
Anesthesia: LOCAL | Laterality: Right

## 2015-07-20 MED ORDER — DIAZEPAM 5 MG PO TABS
10.0000 mg | ORAL_TABLET | ORAL | Status: AC
  Administered 2015-07-20: 10 mg via ORAL
  Filled 2015-07-20: qty 2

## 2015-07-20 MED ORDER — DIPHENHYDRAMINE HCL 25 MG PO CAPS
25.0000 mg | ORAL_CAPSULE | ORAL | Status: AC
  Administered 2015-07-20: 25 mg via ORAL
  Filled 2015-07-20: qty 1

## 2015-07-20 MED ORDER — SODIUM CHLORIDE 0.9 % IV SOLN
INTRAVENOUS | Status: DC
  Administered 2015-07-20: 09:00:00 via INTRAVENOUS

## 2015-07-20 MED ORDER — CIPROFLOXACIN HCL 500 MG PO TABS
500.0000 mg | ORAL_TABLET | ORAL | Status: AC
  Administered 2015-07-20: 500 mg via ORAL
  Filled 2015-07-20: qty 1

## 2015-07-20 NOTE — H&P (Signed)
Sergio Davis is an 55 y.o. male.    Chief Complaint: Pre-op RIGHT shockwave lithotripsy.   HPI:   1 - Rt Renal Stone - 8mm RLP renal stone by CT 06/2015 on eval persistant hematuria. Non-obstructing but progressive as new since 2015. Stone is 8mm, SSD 11cm, 424HU . Most recent UCX normal.   Today Sergio Davis is seen to proceed with right shockwave lithotripsy.   Past Medical History  Diagnosis Date  . Gout   . History of kidney stones 04/2014  . kidney stones     Past Surgical History  Procedure Laterality Date  . Tonsillectomy  1965  . Infertility surgery- 1988  1988    History reviewed. No pertinent family history. Social History:  reports that he has never smoked. He does not have any smokeless tobacco history on file. He reports that he does not drink alcohol or use illicit drugs.  Allergies: No Known Allergies  No prescriptions prior to admission    No results found for this or any previous visit (from the past 48 hour(s)). No results found.  Review of Systems  Constitutional: Negative.  Negative for fever and chills.  HENT: Negative.   Eyes: Negative.   Respiratory: Negative.   Cardiovascular: Negative.   Gastrointestinal: Negative.  Negative for nausea and vomiting.  Genitourinary: Negative.  Negative for flank pain.  Musculoskeletal: Negative.   Skin: Negative.   Neurological: Negative.   Endo/Heme/Allergies: Negative.   Psychiatric/Behavioral: Negative.     Height 6\' 1"  (1.854 m), weight 106.595 kg (235 lb). Physical Exam  Constitutional: He appears well-developed.  HENT:  Head: Normocephalic.  Eyes: Pupils are equal, round, and reactive to light.  Neck: Normal range of motion.  Cardiovascular: Normal rate.   Respiratory: Effort normal.  GI: Soft.  Genitourinary:  No CVAT  Musculoskeletal: Normal range of motion.  Neurological: He is alert.  Skin: Skin is warm.  Psychiatric: He has a normal mood and affect. His behavior is normal. Judgment and  thought content normal.     Assessment/Plan  1 - Rt Renal Stone - We discussed shockwave lithotripsy in detail as well as my "rule of 9s" with stones <39mm, less than 900 HU, and skin to stone distance <9cm having approximately 90% treatment success with single session of treatment. We then addressed how stones that are larger, more dense, and in patients with less favorable anatomy have incrementally decreased success rates. We discussed risks including, bleeding, infection, hematoma, loss of kidney, need for staged therapy, need for adjunctive therapy and requirement to refrain from any anticoagulants, anti-platelet or aspirin-like products peri-procedureally.   After careful consideration, the patient has chosen to proceed.   Sebastian AcheMANNY, Vyom Brass, MD 07/20/2015, 6:06 AM

## 2015-07-20 NOTE — Discharge Instructions (Signed)
1 - You may have urinary urgency (bladder spasms),  bloody urine on / off, and pass small stone fragments x few weeks. This is normal.  2 - Call MD or go to ER for fever >102, severe pain / nausea / vomiting not relieved by medications, or acute change in medical status

## 2016-05-20 ENCOUNTER — Other Ambulatory Visit (HOSPITAL_BASED_OUTPATIENT_CLINIC_OR_DEPARTMENT_OTHER): Payer: Self-pay

## 2016-05-20 DIAGNOSIS — R5383 Other fatigue: Secondary | ICD-10-CM

## 2016-05-20 DIAGNOSIS — G4730 Sleep apnea, unspecified: Secondary | ICD-10-CM

## 2016-05-20 DIAGNOSIS — R0683 Snoring: Secondary | ICD-10-CM

## 2016-05-20 DIAGNOSIS — G4710 Hypersomnia, unspecified: Secondary | ICD-10-CM

## 2016-06-14 ENCOUNTER — Ambulatory Visit (HOSPITAL_BASED_OUTPATIENT_CLINIC_OR_DEPARTMENT_OTHER): Payer: BLUE CROSS/BLUE SHIELD | Attending: Unknown Physician Specialty | Admitting: Internal Medicine

## 2016-06-14 DIAGNOSIS — R5383 Other fatigue: Secondary | ICD-10-CM

## 2016-06-14 DIAGNOSIS — G4733 Obstructive sleep apnea (adult) (pediatric): Secondary | ICD-10-CM | POA: Diagnosis not present

## 2016-06-14 DIAGNOSIS — G4710 Hypersomnia, unspecified: Secondary | ICD-10-CM

## 2016-06-14 DIAGNOSIS — G4730 Sleep apnea, unspecified: Secondary | ICD-10-CM

## 2016-06-14 DIAGNOSIS — R0683 Snoring: Secondary | ICD-10-CM

## 2016-06-19 DIAGNOSIS — R0683 Snoring: Secondary | ICD-10-CM

## 2016-06-19 NOTE — Procedures (Signed)
  Patient Name: Sergio Davis, Sergio Davis Study Date: 06/14/2016 Gender: Male D.O.B: April 06, 1961 Age (years): 55 Referring Provider: Charolett Bumpersichard A Keever Height (inches): 73 Interpreting Physician: Jetty Duhamellinton Panfilo Ketchum MD, ABSM Weight (lbs): 232 RPSGT: Ulyess MortSpruill, Vicki BMI: 31 MRN: [id number] Neck Size: 16.00 CLINICAL INFORMATION Sleep Study Type: NPSG  Indication for sleep study: Excessive Daytime Sleepiness, Fatigue, Hypertension, Snoring, Witnessed Apneas  Epworth Sleepiness Score: 18  SLEEP STUDY TECHNIQUE As per the AASM Manual for the Scoring of Sleep and Associated Events v2.3 (April 2016) with a hypopnea requiring 4% desaturations.  The channels recorded and monitored were frontal, central and occipital EEG, electrooculogram (EOG), submentalis EMG (chin), nasal and oral airflow, thoracic and abdominal wall motion, anterior tibialis EMG, snore microphone, electrocardiogram, and pulse oximetry.  MEDICATIONS Medications self-administered by patient taken the night of the study : none reported  SLEEP ARCHITECTURE The study was initiated at 10:01:41 PM and ended at 4:21:03 AM.  Sleep onset time was 7.7 minutes and the sleep efficiency was 88.6%. The total sleep time was 336.0 minutes.  Stage REM latency was 59.5 minutes.  The patient spent 14.73% of the night in stage N1 sleep, 67.11% in stage N2 sleep, 0.00% in stage N3 and 18.15% in REM.  Alpha intrusion was absent.  Supine sleep was 34.46%.  RESPIRATORY PARAMETERS The overall apnea/hypopnea index (AHI) was 7.5 per hour. There were 0 total apneas, including 0 obstructive, 0 central and 0 mixed apneas. There were 42 hypopneas and 46 RERAs.  The AHI during Stage REM sleep was 9.8 per hour.  AHI while supine was 15.5 per hour.  The mean oxygen saturation was 93.94%. The minimum SpO2 during sleep was 89.00%.  Soft snoring was noted during this study.  CARDIAC DATA The 2 lead EKG demonstrated sinus rhythm. The mean heart rate was 54.37  beats per minute. Other EKG findings include: None.  LEG MOVEMENT DATA The total PLMS were 0 with a resulting PLMS index of 0.00. Associated arousal with leg movement index was 0.0 .  IMPRESSIONS - Mild obstructive sleep apnea occurred during this study (AHI = 7.5/h). - Insufficient early events to meet protocol reuirements for split CPAP titration. - No significant central sleep apnea occurred during this study (CAI = 0.0/h). - The patient had minimal or no oxygen desaturation during the study (Min O2 = 89.00%) - The patient snored with Soft snoring volume. - No cardiac abnormalities were noted during this study. - Clinically significant periodic limb movements did not occur during sleep. No significant associated arousals.  DIAGNOSIS - Obstructive Sleep Apnea (327.23 [G47.33 ICD-10])  RECOMMENDATIONS - Positional therapy avoiding supine position during sleep. - Very mild obstructive sleep apnea. Return to provider to discuss treatment options.  Consider CPAP titration or a fitted oral appliance - Avoid alcohol, sedatives and other CNS depressants that may worsen sleep apnea and disrupt normal sleep architecture. - Sleep hygiene should be reviewed to assess factors that may improve sleep quality. - Weight management and regular exercise should be initiated or continued if appropriate  [Electronically signed] 06/19/2016 12:44 PM  Jetty Duhamellinton Jazariah Teall MD, ABSM Diplomate, American Board of Sleep Medicine   NPI: [id number]  Waymon BudgeYOUNG,Karolina Zamor D Diplomate, American Board of Sleep Medicine  ELECTRONICALLY SIGNED ON:  06/19/2016, 12:33 PM [facility] PH: [phone]   FX: [phone] ACCREDITED BY THE AMERICAN ACADEMY OF SLEEP MEDICINE

## 2020-08-26 ENCOUNTER — Other Ambulatory Visit: Payer: Self-pay | Admitting: Family Medicine

## 2020-08-26 DIAGNOSIS — M5431 Sciatica, right side: Secondary | ICD-10-CM

## 2020-08-26 DIAGNOSIS — M2170 Unequal limb length (acquired), unspecified site: Secondary | ICD-10-CM

## 2020-08-28 ENCOUNTER — Other Ambulatory Visit: Payer: Self-pay | Admitting: Family Medicine

## 2020-08-28 ENCOUNTER — Ambulatory Visit
Admission: RE | Admit: 2020-08-28 | Discharge: 2020-08-28 | Disposition: A | Discharge status: Home / Self Care | Payer: 59 | Source: Ambulatory Visit | Attending: Family Medicine | Admitting: Family Medicine

## 2020-08-28 DIAGNOSIS — M5431 Sciatica, right side: Secondary | ICD-10-CM

## 2020-08-28 DIAGNOSIS — M2170 Unequal limb length (acquired), unspecified site: Secondary | ICD-10-CM

## 2022-06-07 ENCOUNTER — Ambulatory Visit (INDEPENDENT_AMBULATORY_CARE_PROVIDER_SITE_OTHER): Payer: 59 | Admitting: Sports Medicine

## 2022-06-07 DIAGNOSIS — R29898 Other symptoms and signs involving the musculoskeletal system: Secondary | ICD-10-CM | POA: Diagnosis not present

## 2022-06-07 DIAGNOSIS — M25551 Pain in right hip: Secondary | ICD-10-CM | POA: Diagnosis not present

## 2022-06-07 NOTE — Progress Notes (Signed)
Lateral right hip pain Pain has been present for several years Has not had any form of treatment for it Denies any OTC medication  Last xrays were in 2022  Patient was instructed in 10 minutes of therapeutic exercises for right hip pain to improve strength, ROM and function according to my instructions and plan of care by a Certified Athletic Trainer during the office visit. A customized handout was provided and demonstration of proper technique shown and discussed. Patient did perform exercises and demonstrate understanding through teachback.  All questions discussed and answered.

## 2022-06-07 NOTE — Progress Notes (Signed)
Sergio Davis - 62 y.o. male MRN [id number]  Date of birth: [birth year]  Office Visit Note: Visit Date: 06/07/2022 PCP: Sergio Margarita, DO Referred by: Sergio Margarita, DO  Subjective: Chief Complaint  Patient presents with   Right Hip - Pain   HPI: Sergio Davis is a very pleasant 62 y.o. male who presents today for right lateral hip pain.   He has had pain over the lateral hip that will extend down the leg.  This has been present on and off for a few years.  The origin of his pain is over the greater trochanteric region.  He does note that he has some neuropathy, from likely diabetes.  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  He does wear long compression socks as well.  Few weeks ago he was doing more walking and this flared up his pain, however the last few days it has been manageable.  At times he will have pain laying on that side at nighttime.   Pertinent ROS were reviewed with the patient and found to be negative unless otherwise specified above in HPI.   Assessment & Plan: Visit Diagnoses:  1. Greater trochanteric pain syndrome of right lower extremity   2. Weakness of right hip    Plan: Discussed with Sergio Davis the likely etiology of his right hip pain, which is likely from muscular imbalance over the greater trochanteric region with weakness of the gluteus medius and minimus tendons.  He does have mild arthritis of the right hip, although I do not think this is contributing.  We discussed all treatment options such as medication therapy, physical therapy versus home rehab, injection therapy.  He decided on home therapy, we did print out a custom home exercise series for him to perform, and athletic trainer did review and demonstrate these exercises with the patient today.  He will perform these once daily and follow-up in about 6 weeks for reevaluation.  May use over-the-counter Tylenol or ibuprofen as needed if any soreness  results.  Follow-up: Return in about 6 weeks (around 07/19/2022).   Meds & Orders: No orders of the defined types were placed in this encounter.  No orders of the defined types were placed in this encounter.    Procedures: No procedures performed      Clinical History: No specialty comments available.  He reports that he has never smoked. He does not have any smokeless tobacco history on file. No results for input(s): "HGBA1C", "LABURIC" in the last 8760 hours.  Objective:    Physical Exam  Gen: Well-appearing, in no acute distress; non-toxic CV: Well-perfused. Warm.  Resp: Breathing unlabored on room air; no wheezing. Psych: Fluid speech in conversation; appropriate affect; normal thought process Neuro: Sensation intact throughout. No gross coordination deficits.   Ortho Exam - Right hip/leg: + TTP over the greater trochanteric region.  No warmth or swelling noted.  Passive logroll is equivalent bilaterally without restriction to internal or external rotation.  The right leg has about 5 degrees less of internal rotation.  There is weakness with hip abduction and resisted hip abduction on the right leg compared to the left which is full and strength.  Otherwise 5/5 strength in all directions. + Trendelenburg gait with the contralateral hip dropping through ambulation.  Imaging:  DG HIPS BILAT WITH PELVIS MIN 5 VIEWS CLINICAL DATA:  Right leg and hip pain  EXAM: DG HIP (WITH OR WITHOUT PELVIS)  5+V BILAT  COMPARISON:  CT July 23, 2018  FINDINGS: There is no evidence of hip fracture or dislocation. Mild degenerative changes bilateral hips.  IMPRESSION: Mild degenerative changes of the bilateral hips.  Electronically Signed   By: Dahlia Bailiff MD   On: 08/30/2020 15:23 DG Lumbar Spine Complete CLINICAL DATA:  Right leg and hip discomfort  EXAM: LUMBAR SPINE - COMPLETE 4+ VIEW  COMPARISON:  CT abdomen July 23, 2018  FINDINGS: Five lumbar type vertebral bodies.  There is no evidence of lumbar spine fracture. Alignment is normal. Mild L5-S1 discogenic disease. Mild lower lumbar facet hypertrophy. No evidence of instability on flexion or extension views. Aortic atherosclerosis.  IMPRESSION: 1. Mild lower lumbar spondylosis. No acute findings. 2. Aortic atherosclerosis.  Electronically Signed   By: Dahlia Bailiff MD   On: 08/30/2020 15:22    Past Medical/Family/Surgical/Social History: Medications & Allergies reviewed per EMR, new medications updated. There are no problems to display for this patient.  Past Medical History:  Diagnosis Date   Gout    History of kidney stones 04/2014   kidney stones    No family history on file. Past Surgical History:  Procedure Laterality Date   infertility surgery- [place]   Social History   Occupational History   Not on file  Tobacco Use   Smoking status: Never   Smokeless tobacco: Not on file  Substance and Sexual Activity   Alcohol use: No   Drug use: No   Sexual activity: Not on file

## 2022-07-12 ENCOUNTER — Ambulatory Visit: Payer: 59 | Admitting: Sports Medicine

## 2022-07-12 ENCOUNTER — Encounter: Payer: Self-pay | Admitting: Sports Medicine

## 2022-07-12 DIAGNOSIS — M25551 Pain in right hip: Secondary | ICD-10-CM

## 2022-07-12 DIAGNOSIS — R29898 Other symptoms and signs involving the musculoskeletal system: Secondary | ICD-10-CM

## 2022-07-12 NOTE — Progress Notes (Signed)
States he is doing much better; feels like the HEP is really helping Denies any OTC

## 2022-07-12 NOTE — Progress Notes (Signed)
Sergio Davis - 62 y.o. male MRN [id number]  Date of birth: [birth year]  Office Visit Note: Visit Date: 07/12/2022 PCP: Sergio Margarita, DO Referred by: Sergio Margarita, DO  Subjective: Chief Complaint  Patient presents with   Right Hip - Pain   HPI: Sergio Davis is a pleasant 62 y.o. male who presents today for follow-up of right GT pain and hip weakness.  [name] states that he is doing better.  He was very consistent with his home rehab exercises for the first 4 weeks, however with travel and work he has not been able to do that much over the last 2 weeks.  He does notice an improvement in strength.  Does feel like his pain is at least 50% better.  He is no longer having to take any Tylenol or ibuprofen.  Pertinent ROS were reviewed with the patient and found to be negative unless otherwise specified above in HPI.   Assessment & Plan: Visit Diagnoses:  1. Greater trochanteric pain syndrome of right lower extremity   2. Weakness of right hip    Plan: Discussed with [name] that he is making improvements in his strength testing today, his right hip is not quite equivocal to the left hip yet but it is stronger than last visit.  He will continue working through his home therapy and strengthening exercises at home.  I do think after another 4-6 weeks that he will be likely symmetric in his pain improvement should continue.  He can take over-the-counter anti-inflammatories as needed, but does not need to.  He will follow-up with me on an as-needed basis if his hip does not continue to further improve.  Follow-up: Return if symptoms worsen or fail to improve.   Meds & Orders: No orders of the defined types were placed in this encounter.  No orders of the defined types were placed in this encounter.    Procedures: No procedures performed      Clinical History: No specialty comments available.  He reports that he has never smoked. He does not have any smokeless tobacco history on file. No  results for input(s): "HGBA1C", "LABURIC" in the last 8760 hours.  Objective:    Physical Exam  Gen: Well-appearing, in no acute distress; non-toxic CV: Well-perfused. Warm.  Resp: Breathing unlabored on room air; no wheezing. Psych: Fluid speech in conversation; appropriate affect; normal thought process Neuro: Sensation intact throughout. No gross coordination deficits.   Ortho Exam - Right hip: There is a mild TTP over the greater trochanter of the right hip, none on the left hip.  There is very mild restriction of about 10 degrees of loss of internal rotation.  Hip abduction strengthening is improved but is still weaker on the right compared to the left.  Neurovascular intact distally.  Imaging: No results found.  Past Medical/Family/Surgical/Social History: Medications & Allergies reviewed per EMR, new medications updated. There are no problems to display for this patient.  Past Medical History:  Diagnosis Date   Gout    History of kidney stones 04/2014   kidney stones    History reviewed. No pertinent family history. Past Surgical History:  Procedure Laterality Date   infertility surgery- [place]   Social History   Occupational History   Not on file  Tobacco Use   Smoking status: Never   Smokeless tobacco: Not on file  Substance and Sexual Activity   Alcohol use: No   Drug use: No  Sexual activity: Not on file

## 2022-10-04 IMAGING — CR DG LUMBAR SPINE COMPLETE 4+V
7 series · 7 of 7 positions shown · non-contrast
Comparison: CT abdomen July 23, 2018

CLINICAL DATA: Right leg and hip discomfort

EXAM:
LUMBAR SPINE - COMPLETE 4+ VIEW

[t l-spine a.p.]
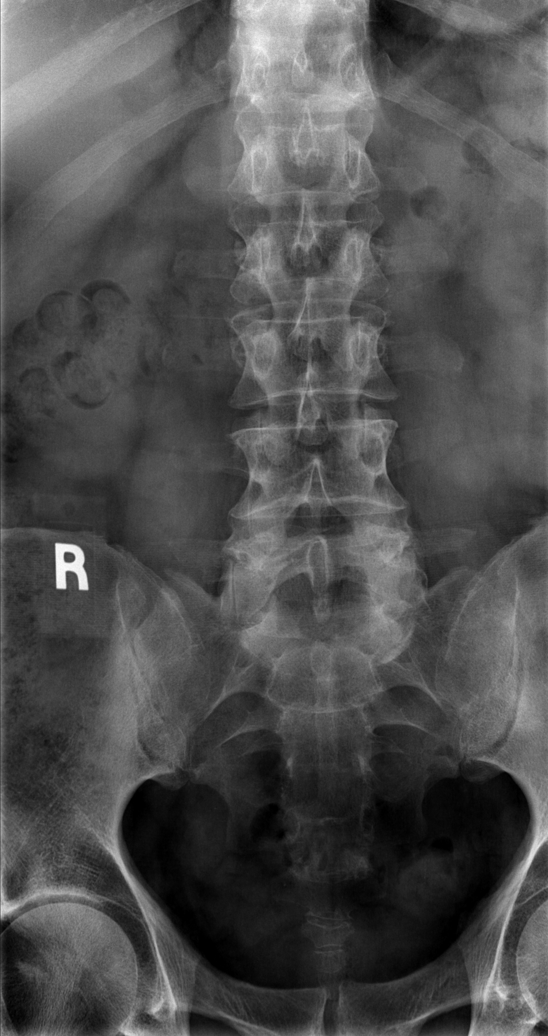

[t l-spine oblique exposure (1 of 2)]
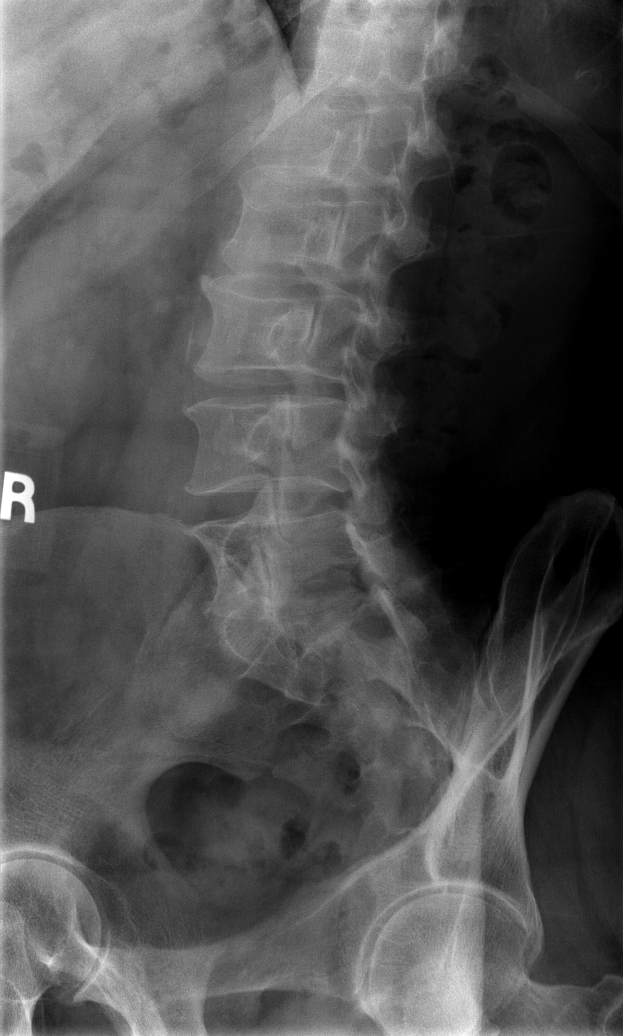

[t l-spine oblique exposure (2 of 2)]
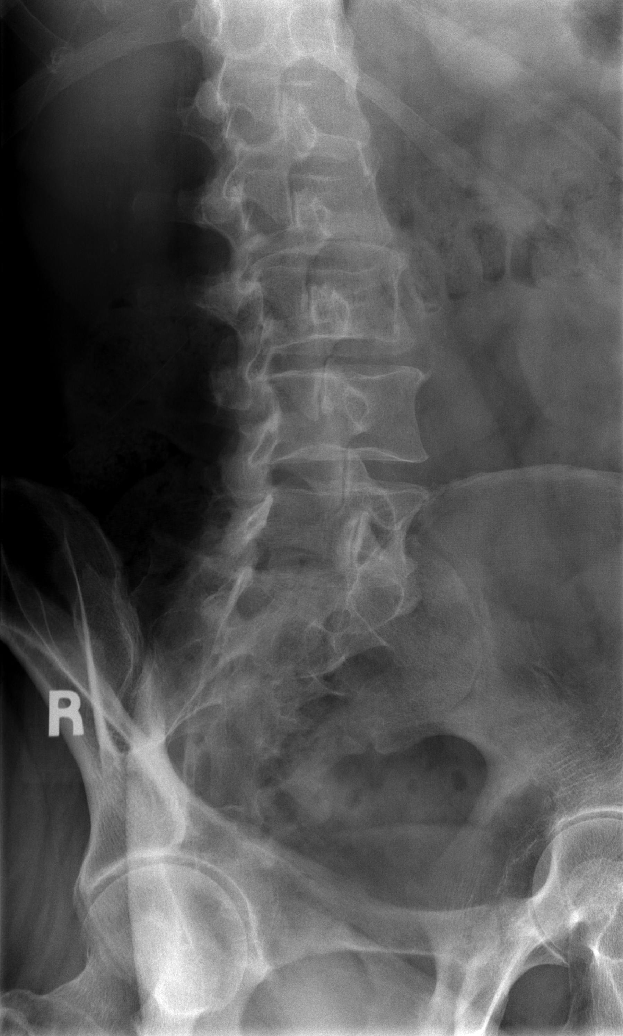

[w l-spine lat]
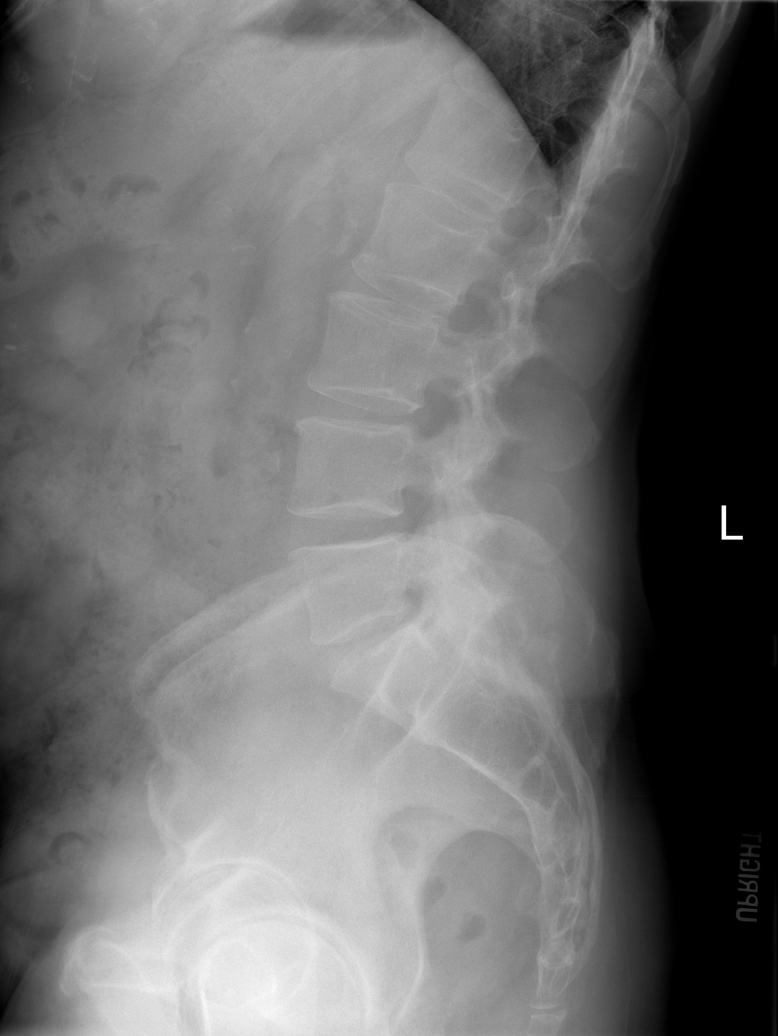

[w l-spine lat * (1 of 3)]
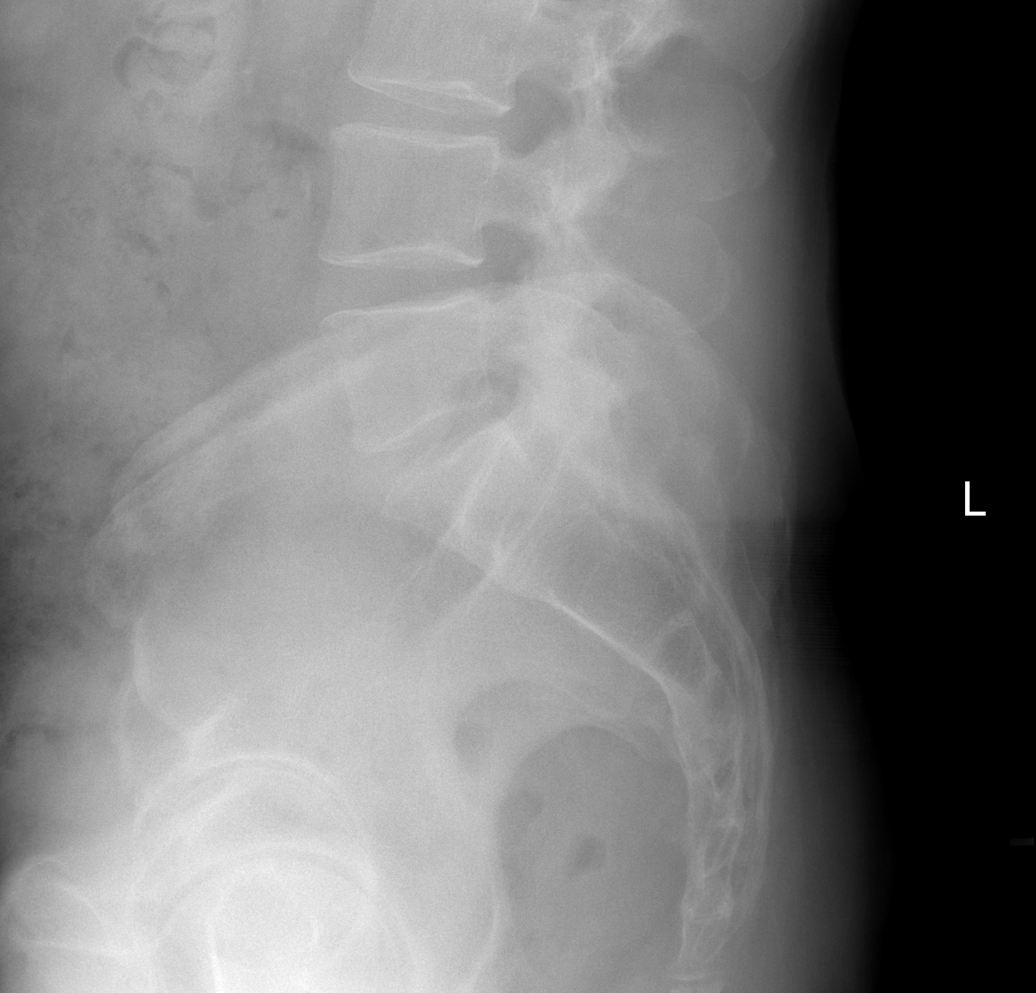

[w l-spine lat * (2 of 3)]
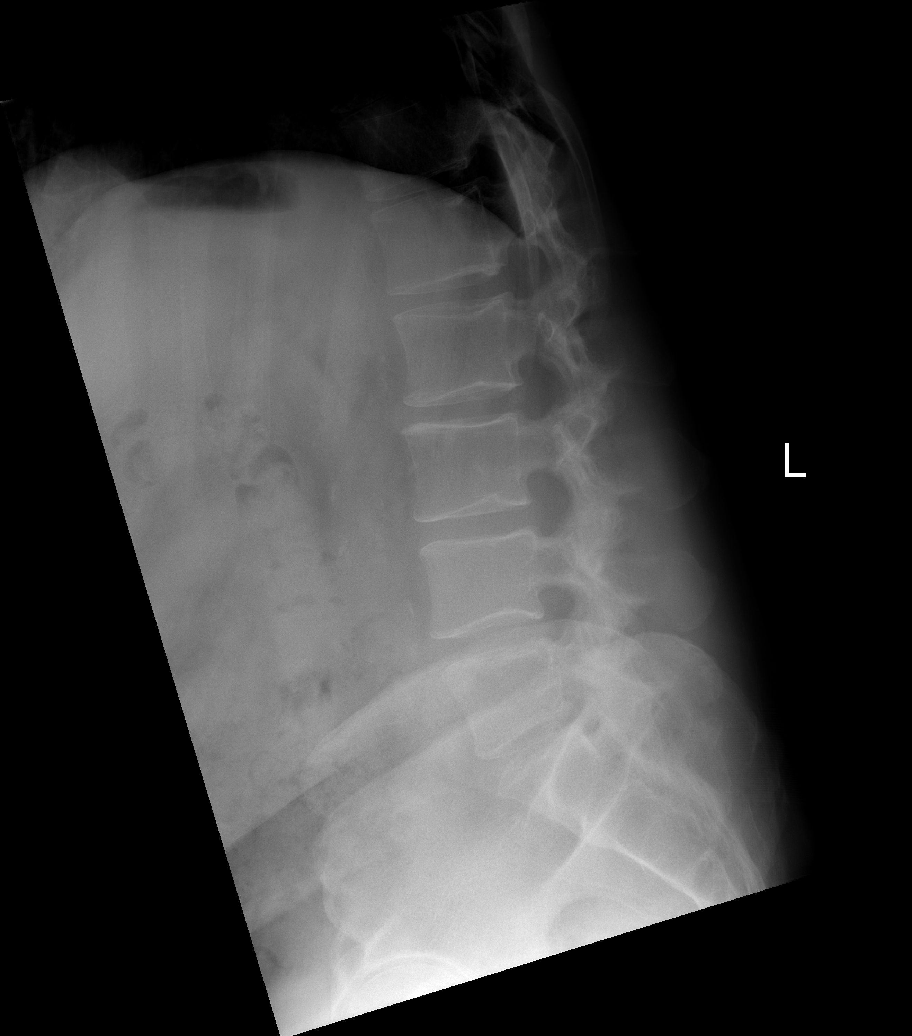

[w l-spine lat * (3 of 3)]
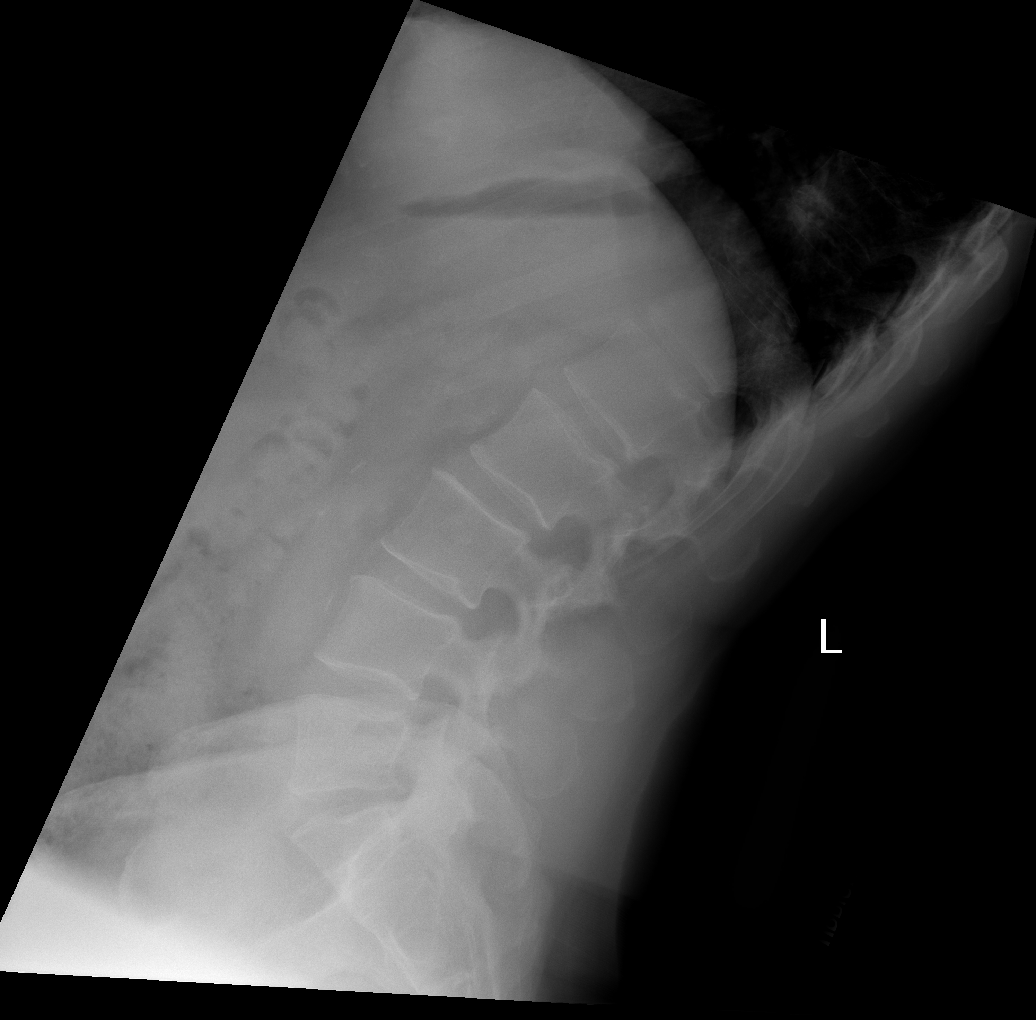

[7 of 7 positions shown; findings below may reference images not displayed]

FINDINGS: Five lumbar type vertebral bodies. There is no evidence of lumbar
spine fracture. Alignment is normal. Mild L5-S1 discogenic disease.
Mild lower lumbar facet hypertrophy. No evidence of instability on
flexion or extension views. Aortic atherosclerosis.
IMPRESSION: 1. Mild lower lumbar spondylosis. No acute findings.
2. Aortic atherosclerosis.

## 2022-12-13 ENCOUNTER — Encounter: Payer: Self-pay | Admitting: Urology

## 2022-12-13 ENCOUNTER — Other Ambulatory Visit: Payer: Self-pay | Admitting: Urology

## 2022-12-13 DIAGNOSIS — R972 Elevated prostate specific antigen [PSA]: Secondary | ICD-10-CM

## 2023-01-11 ENCOUNTER — Ambulatory Visit (HOSPITAL_COMMUNITY)
Admission: RE | Admit: 2023-01-11 | Discharge: 2023-01-11 | Disposition: A | Discharge status: Home / Self Care | Payer: 59 | Source: Ambulatory Visit | Attending: Urology | Admitting: Urology

## 2023-01-11 DIAGNOSIS — R972 Elevated prostate specific antigen [PSA]: Secondary | ICD-10-CM | POA: Diagnosis present

## 2023-01-11 MED ORDER — GADOBUTROL 1 MMOL/ML IV SOLN
10.0000 mL | Freq: Once | INTRAVENOUS | Status: AC | PRN
  Administered 2023-01-11: 10 mL via INTRAVENOUS

## 2023-03-09 DIAGNOSIS — E7800 Pure hypercholesterolemia, unspecified: Secondary | ICD-10-CM | POA: Insufficient documentation

## 2023-03-09 DIAGNOSIS — I10 Essential (primary) hypertension: Secondary | ICD-10-CM | POA: Insufficient documentation

## 2023-03-09 DIAGNOSIS — R7303 Prediabetes: Secondary | ICD-10-CM | POA: Insufficient documentation

## 2023-03-09 NOTE — Progress Notes (Signed)
GU Location of Tumor / Histology: Prostate Ca  If Prostate Cancer, Gleason Score is (3 + 4) and PSA is (4.54 on 12/2022)    Biopsy     01/11/2023 Dr. Cristal Deer Liliane Shi MR Prostate with/without Contrast CLINICAL DATA: Elevated PSA level. R97.20   IMPRESSION: 1. PI-RADS category 4 lesion of the left anterior transition zone and left anterior fibromuscular stroma in the mid gland and apex. 2. PI-RADS category 3 lesion of the right posterolateral peripheral zone at the apex. 3.  Targeting data sent to [facility]. 4. Prostatomegaly and benign prostatic hypertrophy.   Past/Anticipated interventions by urology, if any:     Past/Anticipated interventions by medical oncology, if any: NA  Weight changes, if any:  No  IPSS:  19 SHIM:  5  Bowel/Bladder complaints, if any:  No  Nausea/Vomiting, if any: No  Pain issues, if any:  0/10  SAFETY ISSUES: Prior radiation?  No Pacemaker/ICD? No Possible current pregnancy? Male Is the patient on methotrexate? No  Current Complaints / other details:

## 2023-03-13 DIAGNOSIS — C61 Malignant neoplasm of prostate: Secondary | ICD-10-CM | POA: Insufficient documentation

## 2023-03-13 NOTE — Progress Notes (Signed)
Radiation Oncology         [phone] ________________________________  Initial Outpatient Consultation  Name: Sergio Davis MRN: [id number]  Date: 03/14/2023  DOB: [birth year]  BJ:[id number], Eliberto Ivory, DO  Shelly Rubenstein*   REFERRING PHYSICIAN: Shelly Rubenstein*  DIAGNOSIS: 62 y.o. gentleman with Stage T1c adenocarcinoma of the prostate with Gleason score of 3+4, and PSA of 4.54.    ICD-10-CM   1. Malignant neoplasm of prostate (HCC)  C61       HISTORY OF PRESENT ILLNESS: Sergio Davis is a 62 y.o. male with a diagnosis of prostate cancer. He has been followed by [facility] since at least 2015 for a history of BPH with LUTS (managed with Flomax daily for many years), kidney stones (s/p ESWL in 2017), and erectile dysfunction. He had an elevated PSA at 4.3 in 12/2021 but this returned to normal at 2.84 on repeat in 02/2022. More recently, he was noted to have an elevated PSA of 4.47 on routine labs 10/19/22 with Dr. Liliane Shi. A repeat PSA performed on 12/08/22 remained elevated at 4.54. This prompted a prostate MRI which was performed on 01/11/23 and showed a PI-RADS 4 lesion of of the left anterior transition zone and left anterior fibromuscular stroma in the mid gland and apex as well as a PI-RADS 3 lesion of the right posterolateral peripheral zone at the apex. The patient proceeded to MRI fusion biopsy of the prostate on 02/16/23.  The prostate volume measured 92 cc.  Out of 19 core biopsies, 6 were positive.  The maximum Gleason score was 3+4, and this was seen in all four cores from the MRI ROI on the left as well as in the left apex. Additionally, a small focus of Gleason 3+3 was seen in the left apex lateral.  The patient reviewed the biopsy results with his urologist and he has kindly been referred today for discussion of potential radiation treatment options.  He is accompanied by his wife, Aram Beecham, for today's visit.   PREVIOUS RADIATION THERAPY: No  PAST MEDICAL  HISTORY:  Past Medical History:  Diagnosis Date   Gout    History of kidney stones 04/2014   kidney stones       PAST SURGICAL HISTORY: Past Surgical History:  Procedure Laterality Date   infertility surgery- 1988  1988   TONSILLECTOMY  1965    FAMILY HISTORY: No family history on file.  SOCIAL HISTORY:  Social History   Socioeconomic History   Marital status: Married    Spouse name: Not on file   Number of children: Not on file   Years of education: Not on file   Highest education level: Not on file  Occupational History   Not on file  Tobacco Use   Smoking status: Never   Smokeless tobacco: Not on file  Substance and Sexual Activity   Alcohol use: No   Drug use: No   Sexual activity: Not on file  Other Topics Concern   Not on file  Social History Narrative   Not on file   Social Determinants of Health   Financial Resource Strain: Not on file  Food Insecurity: Not on file  Transportation Needs: Not on file  Physical Activity: Not on file  Stress: Not on file  Social Connections: Not on file  Intimate Partner Violence: Not on file    ALLERGIES: Patient has no known allergies.  MEDICATIONS:  Current Outpatient Medications  Medication Sig Dispense Refill   metFORMIN (GLUCOPHAGE-XR) 500 MG  24 hr tablet Take 1,000 mg by mouth 2 (two) times daily.     acetaminophen (TYLENOL) 500 MG tablet Take 500 mg by mouth as needed.     aspirin 81 MG chewable tablet Chew 81 mg by mouth every morning.     Multiple Vitamin (MULTIVITAMIN WITH MINERALS) TABS tablet Take 1 tablet by mouth daily.     rosuvastatin (CRESTOR) 10 MG tablet Take 10 mg by mouth daily.     tamsulosin (FLOMAX) 0.4 MG CAPS capsule Take 1 capsule (0.4 mg total) by mouth 2 (two) times daily. (Patient not taking: Reported on 07/08/2015) 10 capsule 0   No current facility-administered medications for this encounter.    REVIEW OF SYSTEMS:  On review of systems, the patient reports that he is doing well  overall. He denies any chest pain, shortness of breath, cough, fevers, chills, night sweats, unintended weight changes. He denies any bowel disturbances, and denies abdominal pain, nausea or vomiting. He denies any new musculoskeletal or joint aches or pains. His IPSS was 19, indicating moderate-severe urinary symptoms despite taking Flomax daily as prescribed. His SHIM was 5, indicating he has severe erectile dysfunction. A complete review of systems is obtained and is otherwise negative.    PHYSICAL EXAM:  Wt Readings from Last 3 Encounters:  06/14/16 232 lb (105.2 kg)  07/20/15 245 lb 2 oz (111.2 kg)  05/01/14 237 lb (107.5 kg)   Temp Readings from Last 3 Encounters:  07/20/15 97.5 F (36.4 C) (Oral)  05/01/14 97.7 F (36.5 C)  04/01/14 98 F (36.7 C) (Oral)   BP Readings from Last 3 Encounters:  07/20/15 139/67  05/01/14 (!) 153/89  04/02/14 148/69   Pulse Readings from Last 3 Encounters:  07/20/15 (!) 50  05/01/14 (!) 54  04/02/14 64    /10  In general this is a well appearing Caucasian male in no acute distress. He's alert and oriented x4 and appropriate throughout the examination. Cardiopulmonary assessment is negative for acute distress, and he exhibits normal effort.     KPS = 100  100 - Normal; no complaints; no evidence of disease. 90   - Able to carry on normal activity; minor signs or symptoms of disease. 80   - Normal activity with effort; some signs or symptoms of disease. 65   - Cares for self; unable to carry on normal activity or to do active work. 60   - Requires occasional assistance, but is able to care for most of his personal needs. 50   - Requires considerable assistance and frequent medical care. 40   - Disabled; requires special care and assistance. 30   - Severely disabled; hospital admission is indicated although death not imminent. 20   - Very sick; hospital admission necessary; active supportive treatment necessary. 10   - Moribund; fatal  processes progressing rapidly. 0     - Dead  Karnofsky DA, Abelmann WH, Craver LS and Burchenal JH [phone]) The use of the nitrogen mustards in the palliative treatment of carcinoma: with particular reference to bronchogenic carcinoma Cancer 1 [phone]  LABORATORY DATA:  No results found for: "WBC", "HGB", "HCT", "MCV", "PLT" No results found for: "NA", "K", "CL", "CO2" No results found for: "ALT", "AST", "GGT", "ALKPHOS", "BILITOT"   RADIOGRAPHY: No results found.    IMPRESSION/PLAN: 1. 62 y.o. gentleman with Stage T1c adenocarcinoma of the prostate with Gleason Score of 3+4, and PSA of 4.54. We discussed the patient's workup and outlined the nature of prostate cancer in  this setting. The patient's T stage, Gleason's score, and PSA put him into the favorable intermediate risk group. Accordingly, he is eligible for a variety of potential treatment options including brachytherapy, 5.5 weeks of external radiation, or prostatectomy. We discussed the available radiation techniques, and focused on the details and logistics of delivery. The patient is not an ideal candidate for brachytherapy with a prostate volume of 92 cc and moderate-severe LUTS despite medical management with Flomax daily.  Therefore, we discussed and outlined the risks, benefits, short and long-term effects associated with daily external beam radiotherapy and compared and contrasted these with prostatectomy. We discussed the role of SpaceOAR gel in reducing the rectal toxicity associated with radiotherapy.  We discussed that if he is indeed a surgical candidate, RALP would be a good option as he would likely get the dual benefit of resolving his LUTS associated with BPH as well as curing his prostate cancer.  He appears to have a good understanding of his disease and our treatment recommendations which are of curative intent.  He was encouraged to ask questions that were answered to his stated satisfaction.  At the conclusion of our  conversation, the patient appears to be leaning towards RALP and is interested in moving forward with the scheduled consult visit with Dr. Laverle Patter on 04/11/2023 to discuss his surgical options prior to committing to treatment.  He has our contact information and will let us know if he ultimately elects to proceed with the 5.5 week course of daily external beam radiation and we will proceed with coordinating for fiducial markers and SpaceOAR gel placement, prior to CT simulation, accordingly.  We enjoyed meeting him and his wife today and look forward to following along in his care.  They know that they are welcome to call at anytime with any questions or concerns related to the treatment options discussed today.  We personally spent 70 minutes in this encounter including chart review, reviewing radiological studies, meeting face-to-face with the patient, entering orders and completing documentation.    Marguarite Arbour, PA-C    Margaretmary Dys, MD  [facility]  Radiation Oncology Direct Dial: [phone]  Fax: [phone] [email or web address]  Skype  [email or web address]   This document serves as a record of services personally performed by Margaretmary Dys, MD and Marcello Fennel, PA-C. It was created on their behalf by Mickie Bail, a trained medical scribe. The creation of this record is based on the scribe's personal observations and the provider's statements to them. This document has been checked and approved by the attending provider.

## 2023-03-14 ENCOUNTER — Ambulatory Visit
Admission: RE | Admit: 2023-03-14 | Discharge: 2023-03-14 | Disposition: A | Discharge status: Home / Self Care | Payer: 59 | Source: Ambulatory Visit | Attending: Radiation Oncology | Admitting: Radiation Oncology

## 2023-03-14 ENCOUNTER — Encounter: Payer: Self-pay | Admitting: Radiation Oncology

## 2023-03-14 VITALS — BP 144/65 | HR 64 | Temp 96.9°F | Resp 18 | Ht 73.0 in | Wt 221.4 lb

## 2023-03-14 DIAGNOSIS — Z7984 Long term (current) use of oral hypoglycemic drugs: Secondary | ICD-10-CM | POA: Diagnosis not present

## 2023-03-14 DIAGNOSIS — Z79899 Other long term (current) drug therapy: Secondary | ICD-10-CM | POA: Diagnosis not present

## 2023-03-14 DIAGNOSIS — Z7982 Long term (current) use of aspirin: Secondary | ICD-10-CM | POA: Diagnosis not present

## 2023-03-14 DIAGNOSIS — C61 Malignant neoplasm of prostate: Secondary | ICD-10-CM | POA: Insufficient documentation

## 2023-03-14 DIAGNOSIS — Z87442 Personal history of urinary calculi: Secondary | ICD-10-CM | POA: Diagnosis not present

## 2023-03-14 DIAGNOSIS — M109 Gout, unspecified: Secondary | ICD-10-CM | POA: Diagnosis not present

## 2023-03-14 NOTE — Progress Notes (Signed)
Introduced myself to the patient as the prostate nurse navigator.  No barriers to care identified at this time.  He is here to discuss his radiation treatment options and will have a surgical consult with Dr. Laverle Patter on 12/3.  I gave him my business card and asked him to call me with questions or concerns.  Verbalized understanding.

## 2023-04-13 NOTE — Progress Notes (Signed)
Patient will proceed with a robotic prostatectomy with Dr. Laverle Patter.  No additional needs at this time.

## 2023-04-28 ENCOUNTER — Other Ambulatory Visit: Payer: Self-pay | Admitting: Urology

## 2023-05-10 ENCOUNTER — Other Ambulatory Visit: Payer: Self-pay | Admitting: Medical Genetics

## 2023-05-11 ENCOUNTER — Other Ambulatory Visit (HOSPITAL_COMMUNITY)
Admission: RE | Admit: 2023-05-11 | Discharge: 2023-05-11 | Disposition: A | Discharge status: Home / Self Care | Payer: Self-pay | Source: Ambulatory Visit | Attending: Medical Genetics | Admitting: Medical Genetics

## 2023-05-16 NOTE — Progress Notes (Addendum)
 COVID Vaccine Completed: yes  Date of COVID positive in last 90 days: in October per pt. He cannot remember the dates  PCP - Massie Sewer, DO Cardiologist -   Chest x-ray - n/a EKG - 05/17/23 Epic/chart Stress Test - n/a ECHO - n/a Cardiac Cath - n/a Pacemaker/ICD device last checked: n/a Spinal Cord Stimulator: n/a  Bowel Prep - clears day before, magnesium  citrate and fleet enema. Patient aware and has instructions  Sleep Study - yes, negative CPAP -   Fasting Blood Sugar - 110-138 Checks Blood Sugar 1 times a day  Last dose of GLP1 agonist-  Ozempic every Monday GLP1 instructions:  Hold 7 days before surgery. last dose 05/15/23, do not take 05/22/23   Last dose of SGLT-2 inhibitors-  N/A SGLT-2 instructions:  Hold 3 days before surgery    Blood Thinner Instructions:  Time Aspirin Instructions: ASA 81, hold 5 days Last Dose:  Activity level: Can go up a flight of stairs and perform activities of daily living without stopping and without symptoms of chest pain or shortness of breath.  Anesthesia review:   Patient denies shortness of breath, fever, cough and chest pain at PAT appointment  Patient verbalized understanding of instructions that were given to them at the PAT appointment. Patient was also instructed that they will need to review over the PAT instructions again at home before surgery.

## 2023-05-16 NOTE — Patient Instructions (Addendum)
 SURGICAL WAITING ROOM VISITATION  Patients having surgery or a procedure may have no more than 2 support people in the waiting area - these visitors may rotate.    Children under the age of 28 must have an adult with them who is not the patient.  Due to an increase in RSV and influenza rates and associated hospitalizations, children ages 73 and under may not visit patients in [facility] hospitals.  If the patient needs to stay at the hospital during part of their recovery, the visitor guidelines for inpatient rooms apply. Pre-op nurse will coordinate an appropriate time for 1 support person to accompany patient in pre-op.  This support person may not rotate.    Please refer to the [facility] website for the visitor guidelines for Inpatients (after your surgery is over and you are in a regular room).     Your procedure is scheduled on: 05/25/23   Report to [facility] Main Entrance    Report to admitting at 9:15 AM   Call this number if you have problems the morning of surgery [phone]   Follow a clear liquid diet the day before surgery.   After Midnight nothing to drink.  Water  Non-Citrus Juices (without pulp, NO RED-Apple, White grape, White cranberry) Black Coffee (NO MILK/CREAM OR CREAMERS, sugar ok)  Clear Tea (NO MILK/CREAM OR CREAMERS, sugar ok) regular and decaf                             Plain Jell-O (NO RED)                                           Fruit ices (not with fruit pulp, NO RED)                                     Popsicles (NO RED)                                                               Sports drinks like Gatorade (NO RED)          If you have questions, please contact your surgeon's office.   FOLLOW BOWEL PREP AND ANY ADDITIONAL PRE OP INSTRUCTIONS YOU RECEIVED FROM YOUR SURGEON'S OFFICE!!!  Drink 8oz of Magnesium  Citrate at 12pm the day before your surgery. Use fleet enema the night before you  surgery     Oral Hygiene is also  important to reduce your risk of infection.                                    Remember - BRUSH YOUR TEETH THE MORNING OF SURGERY WITH YOUR REGULAR TOOTHPASTE  DENTURES WILL BE REMOVED PRIOR TO SURGERY PLEASE DO NOT APPLY Poly grip OR ADHESIVES!!!   Stop all vitamins and herbal supplements 7 days before surgery.   Take these medicines the morning of surgery with A SIP OF WATER : Tylenol , Rosuvastatin    DO NOT TAKE ANY ORAL DIABETIC  MEDICATIONS DAY OF YOUR SURGERY  How to Manage Your Diabetes Before and After Surgery  Why is it important to control my blood sugar before and after surgery? Improving blood sugar levels before and after surgery helps healing and can limit problems. A way of improving blood sugar control is eating a healthy diet by:  Eating less sugar and carbohydrates  Increasing activity/exercise  Talking with your doctor about reaching your blood sugar goals High blood sugars (greater than 180 mg/dL) can raise your risk of infections and slow your recovery, so you will need to focus on controlling your diabetes during the weeks before surgery. Make sure that the doctor who takes care of your diabetes knows about your planned surgery including the date and location.  How do I manage my blood sugar before surgery? Check your blood sugar at least 4 times a day, starting 2 days before surgery, to make sure that the level is not too high or low. Check your blood sugar the morning of your surgery when you wake up and every 2 hours until you get to the Short Stay unit. If your blood sugar is less than 70 mg/dL, you will need to treat for low blood sugar: Do not take insulin . Treat a low blood sugar (less than 70 mg/dL) with  cup of clear juice (cranberry or apple), 4 glucose tablets, OR glucose gel. Recheck blood sugar in 15 minutes after treatment (to make sure it is greater than 70 mg/dL). If your blood sugar is not greater than 70 mg/dL on recheck, call [phone] for  further instructions. Report your blood sugar to the short stay nurse when you get to Short Stay.  If you are admitted to the hospital after surgery: Your blood sugar will be checked by the staff and you will probably be given insulin  after surgery (instead of oral diabetes medicines) to make sure you have good blood sugar levels. The goal for blood sugar control after surgery is 80-180 mg/dL.   WHAT DO I DO ABOUT MY DIABETES MEDICATION?  Do not take oral diabetes medicines (pills) the morning of surgery.  Do not  take Ozempic 05/22/23  THE DAY BEFORE SURGERY, take Metformin as prescribed      THE MORNING OF SURGERY, do not take Metformin   DO NOT TAKE THE FOLLOWING 7 DAYS PRIOR TO SURGERY: Ozempic, Wegovy, Rybelsus (Semaglutide), Byetta (exenatide), Bydureon (exenatide ER), Victoza, Saxenda (liraglutide), or Trulicity (dulaglutide) Mounjaro (Tirzepatide) Adlyxin (Lixisenatide), Polyethylene Glycol Loxenatide.   Reviewed and Endorsed by [facility] Patient Education Committee, August 2015                              You may not have any metal on your body including  jewelry, and body piercing             Do not wear lotions, powders, cologne, or deodorant              Men may shave face and neck.   Do not bring valuables to the hospital. [facility] IS NOT             RESPONSIBLE   FOR VALUABLES.   Contacts, glasses, dentures or bridgework may not be worn into surgery.   Bring small overnight bag day of surgery.   DO NOT BRING YOUR HOME MEDICATIONS TO THE HOSPITAL. PHARMACY WILL DISPENSE MEDICATIONS LISTED ON YOUR MEDICATION LIST TO YOU DURING YOUR ADMISSION IN THE  HOSPITAL!   Special Instructions: Bring a copy of your healthcare power of attorney and living will documents the day of surgery if you haven't scanned them before.              Please read over the following fact sheets you were given: IF YOU HAVE QUESTIONS ABOUT YOUR PRE-OP INSTRUCTIONS PLEASE CALL [phone]    If you received a COVID test during your pre-op visit  it is requested that you wear a mask when out in public, stay away from anyone that may not be feeling well and notify your surgeon if you develop symptoms. If you test positive for Covid or have been in contact with anyone that has tested positive in the last 10 days please notify you surgeon.    [facility] - Preparing for Surgery Before surgery, you can play an important role.  Because skin is not sterile, your skin needs to be as free of germs as possible.  You can reduce the number of germs on your skin by washing with CHG (chlorahexidine gluconate) soap before surgery.  CHG is an antiseptic cleaner which kills germs and bonds with the skin to continue killing germs even after washing. Please DO NOT use if you have an allergy to CHG or antibacterial soaps.  If your skin becomes reddened/irritated stop using the CHG and inform your nurse when you arrive at Short Stay. Do not shave (including legs and underarms) for at least 48 hours prior to the first CHG shower.  You may shave your face/neck.  Please follow these instructions carefully:  1.  Shower with CHG Soap the night before surgery and the  morning of surgery.  2.  If you choose to wash your hair, wash your hair first as usual with your normal  shampoo.  3.  After you shampoo, rinse your hair and body thoroughly to remove the shampoo.                             4.  Use CHG as you would any other liquid soap.  You can apply chg directly to the skin and wash.  Gently with a scrungie or clean washcloth.  5.  Apply the CHG Soap to your body ONLY FROM THE NECK DOWN.   Do   not use on face/ open                           Wound or open sores. Avoid contact with eyes, ears mouth and   genitals (private parts).                       Wash face,  Genitals (private parts) with your normal soap.             6.  Wash thoroughly, paying special attention to the area where your    surgery   will be performed.  7.  Thoroughly rinse your body with warm water  from the neck down.  8.  DO NOT shower/wash with your normal soap after using and rinsing off the CHG Soap.                9.  Pat yourself dry with a clean towel.            10.  Wear clean pajamas.  11.  Place clean sheets on your bed the night of your first shower and do not  sleep with pets. Day of Surgery : Do not apply any lotions/deodorants the morning of surgery.  Please wear clean clothes to the hospital/surgery center.  FAILURE TO FOLLOW THESE INSTRUCTIONS MAY RESULT IN THE CANCELLATION OF YOUR SURGERY  PATIENT SIGNATURE_________________________________  NURSE SIGNATURE__________________________________  ________________________________________________________________________  WHAT IS A BLOOD TRANSFUSION? Blood Transfusion Information  A transfusion is the replacement of blood or some of its parts. Blood is made up of multiple cells which provide different functions. Red blood cells carry oxygen and are used for blood loss replacement. White blood cells fight against infection. Platelets control bleeding. Plasma helps clot blood. Other blood products are available for specialized needs, such as hemophilia or other clotting disorders. BEFORE THE TRANSFUSION  Who gives blood for transfusions?  Healthy volunteers who are fully evaluated to make sure their blood is safe. This is blood bank blood. Transfusion therapy is the safest it has ever been in the practice of medicine. Before blood is taken from a donor, a complete history is taken to make sure that person has no history of diseases nor engages in risky social behavior (examples are intravenous drug use or sexual activity with multiple partners). The donor's travel history is screened to minimize risk of transmitting infections, such as malaria. The donated blood is tested for signs of infectious diseases, such as HIV and hepatitis. The blood is then  tested to be sure it is compatible with you in order to minimize the chance of a transfusion reaction. If you or a relative donates blood, this is often done in anticipation of surgery and is not appropriate for emergency situations. It takes many days to process the donated blood. RISKS AND COMPLICATIONS Although transfusion therapy is very safe and saves many lives, the main dangers of transfusion include:  Getting an infectious disease. Developing a transfusion reaction. This is an allergic reaction to something in the blood you were given. Every precaution is taken to prevent this. The decision to have a blood transfusion has been considered carefully by your caregiver before blood is given. Blood is not given unless the benefits outweigh the risks. AFTER THE TRANSFUSION Right after receiving a blood transfusion, you will usually feel much better and more energetic. This is especially true if your red blood cells have gotten low (anemic). The transfusion raises the level of the red blood cells which carry oxygen, and this usually causes an energy increase. The nurse administering the transfusion will monitor you carefully for complications. HOME CARE INSTRUCTIONS  No special instructions are needed after a transfusion. You may find your energy is better. Speak with your caregiver about any limitations on activity for underlying diseases you may have. SEEK MEDICAL CARE IF:  Your condition is not improving after your transfusion. You develop redness or irritation at the intravenous (IV) site. SEEK IMMEDIATE MEDICAL CARE IF:  Any of the following symptoms occur over the next 12 hours: Shaking chills. You have a temperature by mouth above 102 F (38.9 C), not controlled by medicine. Chest, back, or muscle pain. People around you feel you are not acting correctly or are confused. Shortness of breath or difficulty breathing. Dizziness and fainting. You get a rash or develop hives. You have a  decrease in urine output. Your urine turns a dark color or changes to pink, red, or brown. Any of the following symptoms occur over the next 10 days: You have  a temperature by mouth above 102 F (38.9 C), not controlled by medicine. Shortness of breath. Weakness after normal activity. The white part of the eye turns yellow (jaundice). You have a decrease in the amount of urine or are urinating less often. Your urine turns a dark color or changes to pink, red, or brown. Document Released: 04/22/2000 Document Revised: 07/18/2011 Document Reviewed: 12/10/2007 [facility] Patient Information 2014 [place], [place].  _______________________________________________________________________

## 2023-05-17 ENCOUNTER — Encounter (HOSPITAL_COMMUNITY): Payer: Self-pay

## 2023-05-17 ENCOUNTER — Encounter (HOSPITAL_COMMUNITY)
Admission: RE | Admit: 2023-05-17 | Discharge: 2023-05-17 | Disposition: A | Discharge status: Home / Self Care | Payer: 59 | Source: Ambulatory Visit | Attending: Urology | Admitting: Urology

## 2023-05-17 ENCOUNTER — Other Ambulatory Visit: Payer: Self-pay

## 2023-05-17 VITALS — BP 120/72 | HR 79 | Temp 97.8°F | Resp 16 | Ht 73.0 in | Wt 209.0 lb

## 2023-05-17 DIAGNOSIS — E119 Type 2 diabetes mellitus without complications: Secondary | ICD-10-CM | POA: Insufficient documentation

## 2023-05-17 DIAGNOSIS — Z794 Long term (current) use of insulin: Secondary | ICD-10-CM | POA: Diagnosis not present

## 2023-05-17 DIAGNOSIS — Z01818 Encounter for other preprocedural examination: Secondary | ICD-10-CM | POA: Insufficient documentation

## 2023-05-17 HISTORY — DX: Essential (primary) hypertension: I10

## 2023-05-17 HISTORY — DX: Type 2 diabetes mellitus without complications: E11.9

## 2023-05-17 HISTORY — DX: Pure hypercholesterolemia, unspecified: E78.00

## 2023-05-17 LAB — BASIC METABOLIC PANEL
Anion gap: 12 (ref 5–15)
BUN: 15 mg/dL (ref 8–23)
CO2: 24 mmol/L (ref 22–32)
Calcium: 10 mg/dL (ref 8.9–10.3)
Chloride: 101 mmol/L (ref 98–111)
Creatinine, Ser: 0.73 mg/dL (ref 0.61–1.24)
GFR, Estimated: >60 mL/min (ref >60)
Glucose, Bld: 123 mg/dL — ABNORMAL HIGH (ref 70–99)
Potassium: 4.1 mmol/L (ref 3.5–5.1)
Sodium: 137 mmol/L (ref 135–145)

## 2023-05-17 LAB — CBC
HCT: 40.3 % (ref 39.0–52.0)
Hemoglobin: 13.5 g/dL (ref 13.0–17.0)
MCH: 30.1 pg (ref 26.0–34.0)
MCHC: 33.5 g/dL (ref 30.0–36.0)
MCV: 90 fL (ref 80.0–100.0)
Platelets: 303 10*3/uL (ref 150–400)
RBC: 4.48 MIL/uL (ref 4.22–5.81)
RDW: 11.5 % (ref 11.5–15.5)
WBC: 6.8 10*3/uL (ref 4.0–10.5)
nRBC: 0 % (ref 0.0–0.2)

## 2023-05-17 LAB — GLUCOSE, CAPILLARY: Glucose-Capillary: 127 mg/dL — ABNORMAL HIGH (ref 70–99)

## 2023-05-18 LAB — HEMOGLOBIN A1C
Hgb A1c MFr Bld: 6.3 % — ABNORMAL HIGH (ref 4.8–5.6)
Mean Plasma Glucose: 134 mg/dL

## 2023-05-23 LAB — GENECONNECT MOLECULAR SCREEN: Genetic Analysis Overall Interpretation: NEGATIVE

## 2023-05-24 NOTE — Anesthesia Preprocedure Evaluation (Addendum)
Anesthesia Evaluation  Patient identified by MRN, date of birth, ID band Patient awake    Reviewed: Allergy & Precautions, NPO status , Patient's Chart, lab work & pertinent test results  Airway Mallampati: II  TM Distance: >3 FB Neck ROM: Full    Dental no notable dental hx. (+) Teeth Intact, Dental Advisory Given   Pulmonary neg pulmonary ROS   Pulmonary exam normal breath sounds clear to auscultation       Cardiovascular hypertension, Pt. on medications Normal cardiovascular exam Rhythm:Regular Rate:Normal     Neuro/Psych negative neurological ROS  negative psych ROS   GI/Hepatic negative GI ROS,,,  Endo/Other  diabetes, Type 2, Oral Hypoglycemic Agents  On ozempic  Renal/GU Renal diseaseLab Results      Component                Value               Date                           K                        4.1                 05/17/2023                    CREATININE               0.73                05/17/2023                  GLUCOSE                  123 (H)             05/17/2023                Musculoskeletal negative musculoskeletal ROS (+)    Abdominal   Peds  Hematology Lab Results      Component                Value               Date                      WBC                      6.8                 05/17/2023                HGB                      13.5                05/17/2023                HCT                      40.3                05/17/2023                MCV  90.0                05/17/2023                PLT                      303                 05/17/2023              Anesthesia Other Findings All: Ciprofloxacin  Reproductive/Obstetrics negative OB ROS                             Anesthesia Physical Anesthesia Plan  ASA: 3  Anesthesia Plan: General   Post-op Pain Management: Lidocaine infusion*, Ketamine IV* and Ofirmev IV (intra-op)*    Induction: Intravenous  PONV Risk Score and Plan: 3 and Midazolam, Treatment may vary due to age or medical condition, Dexamethasone and Ondansetron  Airway Management Planned: Oral ETT  Additional Equipment: None  Intra-op Plan:   Post-operative Plan: Extubation in OR  Informed Consent: I have reviewed the patients History and Physical, chart, labs and discussed the procedure including the risks, benefits and alternatives for the proposed anesthesia with the patient or authorized representative who has indicated his/her understanding and acceptance.     Dental advisory given  Plan Discussed with: CRNA and Anesthesiologist  Anesthesia Plan Comments:         Anesthesia Quick Evaluation

## 2023-05-24 NOTE — H&P (Addendum)
CC: Prostate Cancer    Sergio Davis is a 63 year old gentleman who was found to have an elevated PSA of 4.54 prompting an MRI of the prostate on 01/11/23. This indicated a 2.1 cm left anterior PI-RADS 4 lesion of the left anterior transition zone and a 1.3 cm PI-RADS 3 lesion of the right apex. He underwent an MR/US fusion biopsy on 02/16/23. This confirmed Gleason 3+4=7 adenocarcinoma with all 4 targeted biopsies from the left anterior lesion and 2 out of 12 systematic biopsies (both at the left apex) positive for malignancy.   Family history: None.   Imaging studies: MRI (01/11/23) - Negative for EPE, SVI, LAD, or bone lesions.   PMH: He has a history of hypertension, urolithiasis, diabetes, hyperlipidemia, and gout.  PSH: No abdominal surgeries.   TNM stage: cT1c N0 Mx  PSA: 4.54  Gleason score: 3+4=7 (GG 2)  Biopsy (02/16/23): 6/20 cores positive  Left: L lateral apex (5%, 3+3=6), L apex (21%, 3+4=7)  Right: Benign  ROI 1: 4/4 cores (64%, 3+4=7)  ROI 2: Benign  Prostate volume: 92 cc   Nomogram  OC disease: 71%  EPE: 28%  SVI: 2%  LNI: 2%  PFS (5 year, 10 year): 87%, 77%   Urinary function: IPSS is 16.  Erectile function: SHIM score is 5. He does have severe erectile dysfunction refractory to oral medical therapy.     ALLERGIES: Cipro    MEDICATIONS: Hydrochlorothiazide 25 mg tablet  Lisinopril 20 mg tablet  Sildenafil Citrate 20 mg tablet TAKE 2 TO 5 TABLETS 1 HOUR PRIOR TO INTERCOURSE  Tamsulosin Hcl 0.4 mg capsule  Advil PRN  Aspirin Ec 81 mg tablet, delayed release Oral  Centrum Silver  Metformin Er Gastric 1,000 mg tablet, er gastric retention 24 hr tablet BID  Rosuvastatin Calcium  Tylenol PRN     GU PSH: ESWL - 2017, 2016 Prostate Needle Biopsy - 02/16/2023       PSH Notes: Renal Lithotripsy, Lithotripsy, Tonsillectomy, Surgery Testis Incision   NON-GU PSH: Remove Tonsils - 2015 Surgical Pathology, Gross And Microscopic Examination For Prostate Needle -  02/16/2023     GU PMH: Pelvic/perineal pain (Stable) - 03/06/2023, - 2021 Prostate Cancer - 03/06/2023 Elevated PSA - 02/16/2023, - 04/22/2022, - 02/21/2022 Flank Pain - 04/22/2022 Renal calculus (Stable) - 04/22/2022, (Stable), Right, - 2020 (Stable), - 2019, - 2018, - 2017, Nephrolithiasis, - 2017 Family Hx of Prostate Cancer - 02/21/2022, (Stable), - 2019, - 2019 BPH w/LUTS - 2022, - 2021, - 2019 LLQ pain - 2022, - 2022 Nocturia (Stable) - 2022, - 2021 (Stable), - 2018, Nocturia, - 2017 Other male ED - 2022, (Improving), - 2019, - 2019 Microscopic hematuria, Hematuria a direct result of plainly visible distal right ureteral calculi. - 2021, - 2017, Asymptomatic microscopic hematuria, - 2017 Ureteral calculus (Stable) - 2021, (Stable), - 2021, Calculus of right ureter, - 2017 Dysuria - 2021 History of urolithiasis - 2020 Low back pain - 2020 Urinary Frequency - 2019 Urinary Urgency - 2019 BPH w/o LUTS - 2018, - 2018, - 2018, - 2017, - 2017 Overactive bladder - 2018 Encounter for Prostate Cancer screening (Stable) - 2018, - 2017 Hematospermia, Hematospermia - 2017 Hydronephrosis Unspec, Hydronephrosis, right - 2015 Hematuria, Unspec, Hematuria - 2014    NON-GU PMH: Encounter for general adult medical examination without abnormal findings, Encounter for preventive health examination - 2017 Personal history of other diseases of the circulatory system, History of hypertension - 2015 Personal history of other diseases of the  musculoskeletal system and connective tissue, History of gout - 2015 Personal history of other endocrine, nutritional and metabolic disease, History of hypercholesterolemia - 2015 Asthma, Asthma - 2014    FAMILY HISTORY: Colon Cancer - Runs In Family Dementia - Father Family Health Status - Father alive at age 80 - Mother Family Health Status - Mother's Age - Mother Family Health Status Number - Mother   SOCIAL HISTORY: Marital Status: Married Preferred  Language: English; Ethnicity: Not Hispanic Or Latino; Race: White     Notes: Never a smoker, Caffeine Use, Alcohol Use, Marital History - Currently Married, Tobacco Use   REVIEW OF SYSTEMS:    GU Review Male:   Patient denies frequent urination, hard to postpone urination, burning/ pain with urination, get up at night to urinate, leakage of urine, stream starts and stops, trouble starting your streams, and have to strain to urinate .  Gastrointestinal (Upper):   Patient denies nausea and vomiting.  Gastrointestinal (Lower):   Patient denies diarrhea and constipation.  Constitutional:   Patient denies fever, night sweats, weight loss, and fatigue.  Skin:   Patient denies skin rash/ lesion and itching.  Eyes:   Patient denies blurred vision and double vision.  Ears/ Nose/ Throat:   Patient denies sore throat and sinus problems.  Hematologic/Lymphatic:   Patient denies swollen glands and easy bruising.  Cardiovascular:   Patient denies leg swelling and chest pains.  Respiratory:   Patient denies cough and shortness of breath.  Endocrine:   Patient denies excessive thirst.  Musculoskeletal:   Patient denies back pain and joint pain.  Neurological:   Patient denies headaches and dizziness.  Psychologic:   Patient denies depression and anxiety.   VITAL SIGNS:     Weight 218 lb / 98.88 kg  Height 73 in / 185.42 cm  BMI 28.8 kg/m    MULTI-SYSTEM PHYSICAL EXAMINATION:    Constitutional: Well-nourished. No physical deformities. Normally developed. Good grooming.  Respiratory: No labored breathing, no use of accessory muscles. Clear bilaterally.  Cardiovascular: Normal temperature, normal extremity pulses, no swelling, no varicosities. Regular rate and rhythm.  Gastrointestinal: No mass, no tenderness, no rigidity, non obese abdomen. He does have a reducible moderately sized right inguinal hernia.     Complexity of Data:  Lab Test Review:   PSA  Records Review:   Pathology Reports, Previous  Patient Records   12/07/22 10/19/22 02/21/22 12/23/21 04/01/19 02/08/18 06/29/16  PSA  Total PSA 4.54 ng/mL 4.47 ng/mL 2.84 ng/mL 4.3 ng/ml 1.62 ng/mL 1.91 ng/mL 1.78 ng/dl  Free PSA 4.09 ng/mL        % Free PSA 23 % PSA          02/27/18 02/21/18 02/08/18  Hormones  Testosterone, Total 200.7 ng/dL 811.9 ng/dL 147.8 ng/dL       ASSESSMENT:      ICD-10 Details  1 GU:   Prostate Cancer - C61    PLAN:      1. Favorable intermediate risk prostate cancer: I had a detailed discussion with Sergio Davis and his wife today regarding his prostate cancer situation. He is well-informed through his prior discussions with Dr. Liliane Shi and Dr. Kathrynn Running. The patient was counseled about the natural history of prostate cancer and the standard treatment options that are available for prostate cancer. It was explained to him how his age and life expectancy, clinical stage, Gleason score/prognostic grade group, and PSA (and PSA density) affect his prognosis, the decision to proceed with additional staging studies, as  well as how that information influences recommended treatment strategies. We discussed the roles for active surveillance, radiation therapy, surgical therapy, androgen deprivation, as well as ablative therapy and other investigational options for the treatment of prostate cancer as appropriate to his individual cancer situation. We discussed the risks and benefits of these options with regard to their impact on cancer control and also in terms of potential adverse events, complications, and impact on quality of life particularly related to urinary and sexual function. The patient was encouraged to ask questions throughout the discussion today and all questions were answered to his stated satisfaction. In addition, the patient was provided with and/or directed to appropriate resources and literature for further education about prostate cancer and treatment options.   We discussed surgical therapy for  prostate cancer including the different available surgical approaches. We discussed, in detail, the risks and expectations of surgery with regard to cancer control, urinary control, and erectile function as well as the expected postoperative recovery process. Additional risks of surgery including but not limited to bleeding, infection, hernia formation, nerve damage, lymphocele formation, bowel/rectal injury potentially necessitating colostomy, damage to the urinary tract resulting in urine leakage, urethral stricture, and the cardiopulmonary risks such as myocardial infarction, stroke, death, venothromboembolism, etc. were explained. The risk of open surgical conversion for robotic/laparoscopic prostatectomy was also discussed.   At this time, he does wish to proceed with surgical therapy and will be scheduled for a bilateral nerve sparing robot-assisted laparoscopic radical prostatectomy and bilateral pelvic lymphadenectomy.   We did specifically discuss his baseline urinary symptoms. He does have chronic prostatitis on his biopsy although his current bothersome symptoms are mostly storage symptoms which may be related to overactive bladder and he has previously taken Myrbetriq. He understands that some of the symptoms may improve and some of them may persist.   2. Symptomatic right inguinal hernia: He is symptomatic from his right inguinal hernia and is potentially interested in having this repaired. At this time, our current plan is to proceed with treatment of his prostate cancer and subsequently refer him to general surgery for treatment of his inguinal hernia postoperatively.

## 2023-05-25 ENCOUNTER — Other Ambulatory Visit: Payer: Self-pay

## 2023-05-25 ENCOUNTER — Encounter (HOSPITAL_COMMUNITY)
Admission: RE | Disposition: A | Discharge status: Home / Self Care | Payer: Self-pay | Source: Ambulatory Visit | Attending: Urology

## 2023-05-25 ENCOUNTER — Observation Stay (HOSPITAL_COMMUNITY)
Admission: RE | Admit: 2023-05-25 | Discharge: 2023-05-26 | Disposition: A | Discharge status: Home / Self Care | Payer: 59 | Source: Ambulatory Visit | Attending: Urology | Admitting: Urology

## 2023-05-25 ENCOUNTER — Encounter (HOSPITAL_COMMUNITY): Payer: Self-pay | Admitting: Urology

## 2023-05-25 ENCOUNTER — Ambulatory Visit (HOSPITAL_BASED_OUTPATIENT_CLINIC_OR_DEPARTMENT_OTHER): Payer: 59 | Admitting: Anesthesiology

## 2023-05-25 ENCOUNTER — Ambulatory Visit (HOSPITAL_COMMUNITY): Payer: 59 | Admitting: Anesthesiology

## 2023-05-25 DIAGNOSIS — Z7982 Long term (current) use of aspirin: Secondary | ICD-10-CM | POA: Insufficient documentation

## 2023-05-25 DIAGNOSIS — I10 Essential (primary) hypertension: Secondary | ICD-10-CM | POA: Insufficient documentation

## 2023-05-25 DIAGNOSIS — Z7984 Long term (current) use of oral hypoglycemic drugs: Secondary | ICD-10-CM | POA: Diagnosis not present

## 2023-05-25 DIAGNOSIS — Z79899 Other long term (current) drug therapy: Secondary | ICD-10-CM | POA: Diagnosis not present

## 2023-05-25 DIAGNOSIS — E119 Type 2 diabetes mellitus without complications: Secondary | ICD-10-CM | POA: Insufficient documentation

## 2023-05-25 DIAGNOSIS — J45909 Unspecified asthma, uncomplicated: Secondary | ICD-10-CM | POA: Insufficient documentation

## 2023-05-25 DIAGNOSIS — C61 Malignant neoplasm of prostate: Secondary | ICD-10-CM | POA: Diagnosis present

## 2023-05-25 HISTORY — PX: ROBOT ASSISTED LAPAROSCOPIC RADICAL PROSTATECTOMY: SHX5141

## 2023-05-25 HISTORY — PX: LYMPHADENECTOMY: SHX5960

## 2023-05-25 LAB — GLUCOSE, CAPILLARY
Glucose-Capillary: 134 mg/dL — ABNORMAL HIGH (ref 70–99)
Glucose-Capillary: 174 mg/dL — ABNORMAL HIGH (ref 70–99)
Glucose-Capillary: 169 mg/dL — ABNORMAL HIGH (ref 70–99)
Glucose-Capillary: 153 mg/dL — ABNORMAL HIGH (ref 70–99)

## 2023-05-25 LAB — TYPE AND SCREEN
ABO/RH(D): AB NEG
Antibody Screen: NEGATIVE

## 2023-05-25 LAB — HEMOGLOBIN AND HEMATOCRIT, BLOOD
HCT: 35.7 % — ABNORMAL LOW (ref 39.0–52.0)
Hemoglobin: 11.8 g/dL — ABNORMAL LOW (ref 13.0–17.0)

## 2023-05-25 LAB — ABO/RH: ABO/RH(D): AB NEG

## 2023-05-25 SURGERY — XI ROBOTIC ASSISTED LAPAROSCOPIC RADICAL PROSTATECTOMY LEVEL 2
Anesthesia: General

## 2023-05-25 MED ORDER — LISINOPRIL-HYDROCHLOROTHIAZIDE 20-25 MG PO TABS: 1.0000 | ORAL_TABLET | Freq: Every day | ORAL | Status: DC

## 2023-05-25 MED ORDER — MORPHINE SULFATE (PF) 2 MG/ML IV SOLN
INTRAVENOUS | Status: AC
  Filled 2023-05-25: qty 1

## 2023-05-25 MED ORDER — INSULIN ASPART 100 UNIT/ML IJ SOLN
0.0000 [IU] | INTRAMUSCULAR | Status: DC
  Administered 2023-05-25 – 2023-05-26 (×2): 3 [IU] via SUBCUTANEOUS

## 2023-05-25 MED ORDER — ONDANSETRON HCL 4 MG/2ML IJ SOLN: 4.0000 mg | INTRAMUSCULAR | Status: DC | PRN

## 2023-05-25 MED ORDER — PROPOFOL 10 MG/ML IV BOLUS
INTRAVENOUS | Status: DC | PRN
  Administered 2023-05-25: 130 mg via INTRAVENOUS

## 2023-05-25 MED ORDER — FLEET ENEMA RE ENEM: 1.0000 | ENEMA | Freq: Once | RECTAL | Status: DC

## 2023-05-25 MED ORDER — BUPIVACAINE-EPINEPHRINE 0.25% -1:200000 IJ SOLN
INTRAMUSCULAR | Status: DC | PRN
  Administered 2023-05-25: 28 mL

## 2023-05-25 MED ORDER — MAGNESIUM CITRATE PO SOLN: 1.0000 | Freq: Once | ORAL | Status: DC

## 2023-05-25 MED ORDER — TRAMADOL HCL 50 MG PO TABS: 50.0000 mg | ORAL_TABLET | Freq: Four times a day (QID) | ORAL | 0 refills | Status: AC | PRN

## 2023-05-25 MED ORDER — MIDAZOLAM HCL 2 MG/2ML IJ SOLN
INTRAMUSCULAR | Status: AC
  Filled 2023-05-25: qty 2

## 2023-05-25 MED ORDER — DIPHENHYDRAMINE HCL 50 MG/ML IJ SOLN: 12.5000 mg | Freq: Four times a day (QID) | INTRAMUSCULAR | Status: DC | PRN

## 2023-05-25 MED ORDER — ACETAMINOPHEN 10 MG/ML IV SOLN: 1000.0000 mg | Freq: Once | INTRAVENOUS | Status: DC | PRN

## 2023-05-25 MED ORDER — CEFAZOLIN SODIUM-DEXTROSE 2-4 GM/100ML-% IV SOLN
2.0000 g | INTRAVENOUS | Status: AC
  Administered 2023-05-25: 2 g via INTRAVENOUS

## 2023-05-25 MED ORDER — DEXMEDETOMIDINE BOLUS VIA INFUSION
0.0844 ug/kg | Freq: Once | INTRAVENOUS | Status: DC
  Filled 2023-05-25: qty 8

## 2023-05-25 MED ORDER — KETOROLAC TROMETHAMINE 0.5 % OP SOLN
1.0000 [drp] | Freq: Four times a day (QID) | OPHTHALMIC | Status: DC
  Administered 2023-05-25: 1 [drp] via OPHTHALMIC
  Filled 2023-05-25: qty 5

## 2023-05-25 MED ORDER — ONDANSETRON HCL 4 MG/2ML IJ SOLN: 4.0000 mg | Freq: Once | INTRAMUSCULAR | Status: DC | PRN

## 2023-05-25 MED ORDER — MIDAZOLAM HCL 5 MG/5ML IJ SOLN
INTRAMUSCULAR | Status: DC | PRN
  Administered 2023-05-25: 2 mg via INTRAVENOUS

## 2023-05-25 MED ORDER — HYOSCYAMINE SULFATE 0.125 MG SL SUBL: 0.1250 mg | SUBLINGUAL_TABLET | Freq: Four times a day (QID) | SUBLINGUAL | Status: DC | PRN

## 2023-05-25 MED ORDER — CEFAZOLIN SODIUM-DEXTROSE 2-4 GM/100ML-% IV SOLN
INTRAVENOUS | Status: AC
  Filled 2023-05-25: qty 100

## 2023-05-25 MED ORDER — OXYCODONE HCL 5 MG PO TABS: 5.0000 mg | ORAL_TABLET | Freq: Once | ORAL | Status: DC | PRN

## 2023-05-25 MED ORDER — BUPIVACAINE-EPINEPHRINE 0.25% -1:200000 IJ SOLN
INTRAMUSCULAR | Status: AC
Start: 2023-05-25 — End: ?
  Filled 2023-05-25: qty 1

## 2023-05-25 MED ORDER — PROPOFOL 10 MG/ML IV BOLUS
INTRAVENOUS | Status: AC
Start: 2023-05-25 — End: ?
  Filled 2023-05-25: qty 20

## 2023-05-25 MED ORDER — ONDANSETRON HCL 4 MG/2ML IJ SOLN
INTRAMUSCULAR | Status: DC | PRN
  Administered 2023-05-25: 4 mg via INTRAVENOUS

## 2023-05-25 MED ORDER — KETOROLAC TROMETHAMINE 15 MG/ML IJ SOLN
INTRAMUSCULAR | Status: AC
  Filled 2023-05-25: qty 1

## 2023-05-25 MED ORDER — HYDROMORPHONE HCL 1 MG/ML IJ SOLN
INTRAMUSCULAR | Status: AC
  Filled 2023-05-25: qty 1

## 2023-05-25 MED ORDER — MORPHINE SULFATE (PF) 2 MG/ML IV SOLN
2.0000 mg | INTRAVENOUS | Status: DC | PRN
Start: 2023-05-25 — End: 2023-05-26
  Administered 2023-05-25: 2 mg via INTRAVENOUS
  Filled 2023-05-25: qty 1

## 2023-05-25 MED ORDER — HYDROMORPHONE HCL 1 MG/ML IJ SOLN
0.2500 mg | INTRAMUSCULAR | Status: DC | PRN
  Administered 2023-05-25: 0.5 mg via INTRAVENOUS

## 2023-05-25 MED ORDER — DEXAMETHASONE SODIUM PHOSPHATE 10 MG/ML IJ SOLN
INTRAMUSCULAR | Status: DC | PRN
  Administered 2023-05-25: 10 mg via INTRAVENOUS

## 2023-05-25 MED ORDER — ROCURONIUM BROMIDE 10 MG/ML (PF) SYRINGE
PREFILLED_SYRINGE | INTRAVENOUS | Status: DC | PRN
  Administered 2023-05-25: 70 mg via INTRAVENOUS
  Administered 2023-05-25: 10 mg via INTRAVENOUS

## 2023-05-25 MED ORDER — FENTANYL CITRATE (PF) 100 MCG/2ML IJ SOLN
INTRAMUSCULAR | Status: AC
  Filled 2023-05-25: qty 2

## 2023-05-25 MED ORDER — SODIUM CHLORIDE 0.9 % IV BOLUS
1000.0000 mL | Freq: Once | INTRAVENOUS | Status: AC
  Administered 2023-05-25: 1000 mL via INTRAVENOUS

## 2023-05-25 MED ORDER — FENTANYL CITRATE (PF) 100 MCG/2ML IJ SOLN
INTRAMUSCULAR | Status: DC | PRN
  Administered 2023-05-25: 100 ug via INTRAVENOUS
  Administered 2023-05-25 (×2): 50 ug via INTRAVENOUS

## 2023-05-25 MED ORDER — KETOROLAC TROMETHAMINE 15 MG/ML IJ SOLN
15.0000 mg | Freq: Four times a day (QID) | INTRAMUSCULAR | Status: DC
  Administered 2023-05-25 – 2023-05-26 (×4): 15 mg via INTRAVENOUS
  Filled 2023-05-25 (×3): qty 1

## 2023-05-25 MED ORDER — ROSUVASTATIN CALCIUM 10 MG PO TABS
10.0000 mg | ORAL_TABLET | Freq: Every day | ORAL | Status: DC
  Administered 2023-05-26: 10 mg via ORAL
  Filled 2023-05-25: qty 1

## 2023-05-25 MED ORDER — STERILE WATER FOR IRRIGATION IR SOLN
Status: DC | PRN
  Administered 2023-05-25: 1000 mL

## 2023-05-25 MED ORDER — LACTATED RINGERS IV SOLN: INTRAVENOUS | Status: DC

## 2023-05-25 MED ORDER — KETAMINE HCL 10 MG/ML IJ SOLN
INTRAMUSCULAR | Status: DC | PRN
  Administered 2023-05-25: 10 mg via INTRAVENOUS
  Administered 2023-05-25 (×2): 20 mg via INTRAVENOUS

## 2023-05-25 MED ORDER — TRIPLE ANTIBIOTIC 3.5-400-5000 EX OINT: 1.0000 | TOPICAL_OINTMENT | Freq: Three times a day (TID) | CUTANEOUS | Status: DC | PRN

## 2023-05-25 MED ORDER — CEFAZOLIN SODIUM-DEXTROSE 1-4 GM/50ML-% IV SOLN
1.0000 g | Freq: Three times a day (TID) | INTRAVENOUS | Status: AC
  Administered 2023-05-25 – 2023-05-26 (×2): 1 g via INTRAVENOUS
  Filled 2023-05-25 (×3): qty 50

## 2023-05-25 MED ORDER — KETAMINE HCL 50 MG/5ML IJ SOSY
PREFILLED_SYRINGE | INTRAMUSCULAR | Status: AC
  Filled 2023-05-25: qty 5

## 2023-05-25 MED ORDER — ACETAMINOPHEN 10 MG/ML IV SOLN
INTRAVENOUS | Status: AC
  Filled 2023-05-25: qty 100

## 2023-05-25 MED ORDER — ACETAMINOPHEN 10 MG/ML IV SOLN
INTRAVENOUS | Status: DC | PRN
  Administered 2023-05-25: 1000 mg via INTRAVENOUS

## 2023-05-25 MED ORDER — ACETAMINOPHEN 325 MG PO TABS: 650.0000 mg | ORAL_TABLET | ORAL | Status: DC | PRN

## 2023-05-25 MED ORDER — LIDOCAINE HCL 2 % IJ SOLN
INTRAMUSCULAR | Status: AC
Start: 2023-05-25 — End: ?
  Filled 2023-05-25: qty 40

## 2023-05-25 MED ORDER — LACTATED RINGERS IV SOLN: INTRAVENOUS | Status: DC | PRN

## 2023-05-25 MED ORDER — OXYCODONE HCL 5 MG/5ML PO SOLN: 5.0000 mg | Freq: Once | ORAL | Status: DC | PRN

## 2023-05-25 MED ORDER — DOCUSATE SODIUM 100 MG PO CAPS
100.0000 mg | ORAL_CAPSULE | Freq: Two times a day (BID) | ORAL | Status: DC
  Administered 2023-05-25 – 2023-05-26 (×2): 100 mg via ORAL
  Filled 2023-05-25 (×2): qty 1

## 2023-05-25 MED ORDER — HYDROCHLOROTHIAZIDE 25 MG PO TABS
25.0000 mg | ORAL_TABLET | Freq: Every day | ORAL | Status: DC
  Administered 2023-05-25 – 2023-05-26 (×2): 25 mg via ORAL
  Filled 2023-05-25 (×2): qty 1

## 2023-05-25 MED ORDER — DOCUSATE SODIUM 100 MG PO CAPS: 100.0000 mg | ORAL_CAPSULE | Freq: Two times a day (BID) | ORAL | Status: AC

## 2023-05-25 MED ORDER — LIDOCAINE HCL (PF) 2 % IJ SOLN
INTRAMUSCULAR | Status: DC | PRN
  Administered 2023-05-25: 1.5 mg/kg/h

## 2023-05-25 MED ORDER — POLYMYXIN B-TRIMETHOPRIM 10000-0.1 UNIT/ML-% OP SOLN
1.0000 [drp] | Freq: Four times a day (QID) | OPHTHALMIC | Status: DC
  Administered 2023-05-25: 1 [drp] via OPHTHALMIC
  Filled 2023-05-25: qty 10

## 2023-05-25 MED ORDER — LISINOPRIL 20 MG PO TABS
20.0000 mg | ORAL_TABLET | Freq: Every day | ORAL | Status: DC
  Administered 2023-05-25 – 2023-05-26 (×2): 20 mg via ORAL
  Filled 2023-05-25 (×2): qty 1

## 2023-05-25 MED ORDER — DIPHENHYDRAMINE HCL 12.5 MG/5ML PO ELIX: 12.5000 mg | ORAL_SOLUTION | Freq: Four times a day (QID) | ORAL | Status: DC | PRN

## 2023-05-25 MED ORDER — ORAL CARE MOUTH RINSE: 15.0000 mL | Freq: Once | OROMUCOSAL | Status: DC

## 2023-05-25 MED ORDER — ZOLPIDEM TARTRATE 5 MG PO TABS: 5.0000 mg | ORAL_TABLET | Freq: Every evening | ORAL | Status: DC | PRN

## 2023-05-25 MED ORDER — SULFAMETHOXAZOLE-TRIMETHOPRIM 800-160 MG PO TABS: 1.0000 | ORAL_TABLET | Freq: Two times a day (BID) | ORAL | 0 refills | Status: AC

## 2023-05-25 MED ORDER — LIDOCAINE 2% (20 MG/ML) 5 ML SYRINGE
INTRAMUSCULAR | Status: DC | PRN
  Administered 2023-05-25: 100 mg via INTRAVENOUS

## 2023-05-25 MED ORDER — BSS IO SOLN
15.0000 mL | Freq: Once | INTRAOCULAR | Status: AC
Start: 2023-05-25 — End: 2023-05-25
  Administered 2023-05-25: 15 mL
  Filled 2023-05-25: qty 15

## 2023-05-25 MED ORDER — CHLORHEXIDINE GLUCONATE 0.12 % MT SOLN
15.0000 mL | Freq: Once | OROMUCOSAL | Status: DC
Start: 2023-05-25 — End: 2023-05-25

## 2023-05-25 MED ORDER — POTASSIUM CHLORIDE IN NACL 20-0.45 MEQ/L-% IV SOLN
INTRAVENOUS | Status: DC
  Filled 2023-05-25 (×2): qty 1000

## 2023-05-25 MED ORDER — INSULIN ASPART 100 UNIT/ML IJ SOLN: 0.0000 [IU] | INTRAMUSCULAR | Status: DC | PRN

## 2023-05-25 SURGICAL SUPPLY — 58 items
APPLICATOR COTTON TIP 6 STRL (MISCELLANEOUS) ×3 IMPLANT
APPLICATOR COTTON TIP 6IN STRL (MISCELLANEOUS) ×2
BAG COUNTER SPONGE SURGICOUNT (BAG) IMPLANT
CATH FOLEY 2WAY SLVR 18FR 30CC (CATHETERS) ×3 IMPLANT
CATH ROBINSON RED A/P 16FR (CATHETERS) ×3 IMPLANT
CATH ROBINSON RED A/P 8FR (CATHETERS) ×3 IMPLANT
CATH TIEMANN FOLEY 18FR 5CC (CATHETERS) ×3 IMPLANT
CHLORAPREP W/TINT 26 (MISCELLANEOUS) ×3 IMPLANT
CLIP LIGATING HEM O LOK PURPLE (MISCELLANEOUS) ×3 IMPLANT
COVER SURGICAL LIGHT HANDLE (MISCELLANEOUS) ×3 IMPLANT
COVER TIP SHEARS 8 DVNC (MISCELLANEOUS) ×3 IMPLANT
CUTTER ECHEON FLEX ENDO 45 340 (ENDOMECHANICALS) ×3 IMPLANT
DERMABOND ADVANCED .7 DNX12 (GAUZE/BANDAGES/DRESSINGS) ×3 IMPLANT
DRAIN CHANNEL RND F F (WOUND CARE) IMPLANT
DRAPE ARM DVNC X/XI (DISPOSABLE) ×12 IMPLANT
DRAPE COLUMN DVNC XI (DISPOSABLE) ×3 IMPLANT
DRAPE SURG IRRIG POUCH 19X23 (DRAPES) ×3 IMPLANT
DRIVER NDL LRG 8 DVNC XI (INSTRUMENTS) ×6 IMPLANT
DRIVER NDLE LRG 8 DVNC XI (INSTRUMENTS) ×4
DRSG TEGADERM 4X4.75 (GAUZE/BANDAGES/DRESSINGS) ×3 IMPLANT
ELECT PENCIL ROCKER SW 15FT (MISCELLANEOUS) ×3 IMPLANT
ELECT REM PT RETURN 15FT ADLT (MISCELLANEOUS) ×3 IMPLANT
FORCEPS BPLR LNG DVNC XI (INSTRUMENTS) ×3 IMPLANT
FORCEPS PROGRASP DVNC XI (FORCEP) ×3 IMPLANT
GAUZE SPONGE 4X4 12PLY STRL (GAUZE/BANDAGES/DRESSINGS) ×3 IMPLANT
GLOVE BIO SURGEON STRL SZ 6.5 (GLOVE) ×3 IMPLANT
GLOVE SURG LX STRL 7.5 STRW (GLOVE) ×6 IMPLANT
GOWN STRL REUS W/ TWL XL LVL3 (GOWN DISPOSABLE) ×6 IMPLANT
GOWN STRL SURGICAL XL XLNG (GOWN DISPOSABLE) ×3 IMPLANT
HOLDER FOLEY CATH W/STRAP (MISCELLANEOUS) ×3 IMPLANT
IRRIG SUCT STRYKERFLOW 2 WTIP (MISCELLANEOUS) ×2
IRRIGATION SUCT STRKRFLW 2 WTP (MISCELLANEOUS) ×3 IMPLANT
IV LACTATED RINGERS 1000ML (IV SOLUTION) ×3 IMPLANT
KIT TURNOVER KIT A (KITS) IMPLANT
NDL SAFETY ECLIPSE 18X1.5 (NEEDLE) IMPLANT
PACK ROBOT UROLOGY CUSTOM (CUSTOM PROCEDURE TRAY) ×3 IMPLANT
PLUG CATH AND CAP STRL 200 (CATHETERS) ×3 IMPLANT
RELOAD STAPLE 45 4.1 GRN THCK (STAPLE) ×3 IMPLANT
SCISSORS MNPLR CVD DVNC XI (INSTRUMENTS) ×3 IMPLANT
SEAL UNIV 5-12 XI (MISCELLANEOUS) ×12 IMPLANT
SET CYSTO W/LG BORE CLAMP LF (SET/KITS/TRAYS/PACK) IMPLANT
SET TUBE SMOKE EVAC HIGH FLOW (TUBING) ×3 IMPLANT
SOL ELECTROSURG ANTI STICK (MISCELLANEOUS) ×2
SOL PREP POV-IOD 4OZ 10% (MISCELLANEOUS) ×3 IMPLANT
SOLUTION ELECTROSURG ANTI STCK (MISCELLANEOUS) ×3 IMPLANT
SPIKE FLUID TRANSFER (MISCELLANEOUS) ×3 IMPLANT
STAPLE RELOAD 45 GRN (STAPLE) ×2
SUT ETHILON 3 0 PS 1 (SUTURE) ×3 IMPLANT
SUT MNCRL 3 0 RB1 (SUTURE) ×3 IMPLANT
SUT MNCRL 3 0 VIOLET RB1 (SUTURE) ×3 IMPLANT
SUT MNCRL AB 4-0 PS2 18 (SUTURE) ×6 IMPLANT
SUT PDS PLUS AB 0 CT-2 (SUTURE) ×6 IMPLANT
SUT VIC AB 0 CT1 27XBRD ANTBC (SUTURE) ×6 IMPLANT
SUT VIC AB 2-0 SH 27X BRD (SUTURE) ×3 IMPLANT
SUT VIC AB 3-0 SH 27X BRD (SUTURE) IMPLANT
SYR 27GX1/2 1ML LL SAFETY (SYRINGE) ×3 IMPLANT
TROCAR Z THREAD OPTICAL 12X100 (TROCAR) IMPLANT
WATER STERILE IRR 1000ML POUR (IV SOLUTION) ×3 IMPLANT

## 2023-05-25 NOTE — Anesthesia Procedure Notes (Signed)
Procedure Name: Intubation Date/Time: 05/25/2023 10:29 AM  Performed by: Doran Clay, CRNAPre-anesthesia Checklist: Patient identified, Emergency Drugs available, Suction available, Patient being monitored and Timeout performed Patient Re-evaluated:Patient Re-evaluated prior to induction Oxygen Delivery Method: Circle system utilized Preoxygenation: Pre-oxygenation with 100% oxygen Induction Type: IV induction Ventilation: Oral airway inserted - appropriate to patient size and Mask ventilation without difficulty Laryngoscope Size: Mac and 4 Grade View: Grade I Tube size: 7.5 mm Number of attempts: 1 Airway Equipment and Method: Stylet Placement Confirmation: ETT inserted through vocal cords under direct vision, positive ETCO2 and breath sounds checked- equal and bilateral Secured at: 22 cm Tube secured with: Tape Dental Injury: Teeth and Oropharynx as per pre-operative assessment

## 2023-05-25 NOTE — Anesthesia Postprocedure Evaluation (Signed)
Anesthesia Post Note  Patient: [name]  Procedure(s) Performed: XI ROBOTIC ASSISTED LAPAROSCOPIC RADICAL PROSTATECTOMY LEVEL 2 BILATERAL PELVIC LYMPHADENECTOMY (Bilateral)     Patient location during evaluation: PACU Anesthesia Type: General Level of consciousness: awake and alert Pain management: pain level controlled Vital Signs Assessment: post-procedure vital signs reviewed and stable Respiratory status: spontaneous breathing, nonlabored ventilation, respiratory function stable and patient connected to nasal cannula oxygen Cardiovascular status: blood pressure returned to baseline and stable Postop Assessment: no apparent nausea or vomiting Anesthetic complications: no   No notable events documented.  Last Vitals:  Vitals:   05/25/23 1415 05/25/23 1500  BP: (!) 145/64 (!) 148/64  Pulse: 76 75  Resp: 11 13  Temp:    SpO2: 95% 97%    Last Pain:  Vitals:   05/25/23 1537  TempSrc:   PainSc: 5                  Trevor Iha

## 2023-05-25 NOTE — Op Note (Signed)
Preoperative diagnosis: Clinically localized adenocarcinoma of the prostate (clinical stage T1c N0 Mx)  Postoperative diagnosis: Clinically localized adenocarcinoma of the prostate (clinical stage T1c N0 Mx)  Procedure:  Robotic assisted laparoscopic radical prostatectomy (bilateral nerve sparing) Bilateral robotic assisted laparoscopic pelvic lymphadenectomy  Surgeon: Moody Bruins. M.D.  Assistant: Harrie Foreman, PA-C  An assistant was required for this surgical procedure.  The duties of the assistant included but were not limited to suctioning, passing suture, camera manipulation, retraction. This procedure would not be able to be performed without an Geophysicist/field seismologist.  Anesthesia: General  Complications: None  EBL: 150 mL  IVF:  1500 mL crystalloid  Specimens: Prostate and seminal vesicles Right pelvic lymph nodes Left pelvic lymph nodes  Disposition of specimens: Pathology  Drains: 20 Fr coude catheter # 19 Blake pelvic drain  Indication: Sergio Davis is a 63 y.o. year old patient with clinically localized prostate cancer.  After a thorough review of the management options for treatment of prostate cancer, he elected to proceed with surgical therapy and the above procedure(s).  We have discussed the potential benefits and risks of the procedure, side effects of the proposed treatment, the likelihood of the patient achieving the goals of the procedure, and any potential problems that might occur during the procedure or recuperation. Informed consent has been obtained.  Description of procedure:  The patient was taken to the operating room and a general anesthetic was administered. He was given preoperative antibiotics, placed in the dorsal lithotomy position, and prepped and draped in the usual sterile fashion. Next a preoperative timeout was performed. A urethral catheter was placed into the bladder and a site was selected near the umbilicus for placement of the camera  port. This was placed using a standard open Hassan technique which allowed entry into the peritoneal cavity under direct vision and without difficulty. An 8 mm robotic port was placed and a pneumoperitoneum established. The camera was then used to inspect the abdomen and there was no evidence of any intra-abdominal injuries or other abnormalities. The remaining abdominal ports were then placed. 8 mm robotic ports were placed in the right lower quadrant, left lower quadrant, and far left lateral abdominal wall. A 5 mm port was placed in the right upper quadrant and a 12 mm port was placed in the right lateral abdominal wall for laparoscopic assistance. All ports were placed under direct vision without difficulty. The surgical cart was then docked.   Utilizing the cautery scissors, the bladder was reflected posteriorly allowing entry into the space of Retzius and identification of the endopelvic fascia and prostate. The periprostatic fat was then removed from the prostate allowing full exposure of the endopelvic fascia. The endopelvic fascia was then incised from the apex back to the base of the prostate bilaterally and the underlying levator muscle fibers were swept laterally off the prostate thereby isolating the dorsal venous complex. The dorsal vein was then stapled and divided with a 45 mm Flex Echelon stapler. Attention then turned to the bladder neck which was divided anteriorly thereby allowing entry into the bladder and exposure of the urethral catheter. The catheter balloon was deflated and the catheter was brought into the operative field and used to retract the prostate anteriorly. The posterior bladder neck was then examined and was divided allowing further dissection between the bladder and prostate posteriorly until the vasa deferentia and seminal vessels were identified. The vasa deferentia were isolated, divided, and lifted anteriorly. The seminal vesicles were dissected down to  their tips with care  to control the seminal vascular arterial blood supply. These structures were then lifted anteriorly and the space between Denonvillier's fascia and the anterior rectum was developed with a combination of sharp and blunt dissection. This isolated the vascular pedicles of the prostate.  The lateral prostatic fascia was then sharply incised allowing release of the neurovascular bundles bilaterally. The vascular pedicles of the prostate were then ligated with Weck clips between the prostate and neurovascular bundles and divided with sharp cold scissor dissection resulting in neurovascular bundle preservation. The neurovascular bundles were then separated off the apex of the prostate and urethra bilaterally.  The urethra was then sharply transected allowing the prostate specimen to be disarticulated. The pelvis was copiously irrigated and hemostasis was ensured. There was no evidence for rectal injury.  Attention then turned to the right pelvic sidewall. The fibrofatty tissue between the external iliac vein, confluence of the iliac vessels, hypogastric artery, and Cooper's ligament was dissected free from the pelvic sidewall with care to preserve the obturator nerve. Weck clips were used for lymphostasis and hemostasis. An identical procedure was performed on the contralateral side and the lymphatic packets were removed for permanent pathologic analysis.  Attention then turned to the urethral anastomosis. A 2-0 Vicryl slip knot was placed between Denonvillier's fascia, the posterior bladder neck, and the posterior urethra to reapproximate these structures. A double-armed 3-0 Monocryl suture was then used to perform a 360 running tension-free anastomosis between the bladder neck and urethra. A new urethral catheter was then placed into the bladder and irrigated. There were no blood clots within the bladder and the anastomosis appeared to be watertight. A #19 Blake drain was then brought through the left lateral 8  mm port site and positioned appropriately within the pelvis. It was secured to the skin with a nylon suture. The surgical cart was then undocked. The right lateral 12 mm port site was closed at the fascial level with a 0 Vicryl suture placed laparoscopically. All remaining ports were then removed under direct vision. The prostate specimen was removed intact within the Endopouch retrieval bag via the periumbilical camera port site. This fascial opening was closed with two running 0 PDS sutures. 0.25% Marcaine was then injected into all port sites and all incisions were reapproximated at the skin level with 4-0 Monocryl subcuticular sutures and Dermabond. The patient appeared to tolerate the procedure well and without complications. The patient was able to be extubated and transferred to the recovery unit in satisfactory condition.   Moody Bruins MD

## 2023-05-25 NOTE — Discharge Instructions (Addendum)
Activity:  You are encouraged to ambulate frequently (about every hour during waking hours) to help prevent blood clots from forming in your legs or lungs.  However, you should not engage in any heavy lifting (> 10-15 lbs), strenuous activity, or straining. Diet: You should continue a clear liquid diet until passing gas from below.  Once this occurs, you may advance your diet to a soft diet that would be easy to digest (i.e soups, scrambled eggs, mashed potatoes, etc.) for 24 hours just as you would if getting over a bad stomach flu.  If tolerating this diet well for 24 hours, you may then begin eating regular food.  It will be normal to have some amount of bloating, nausea, and abdominal discomfort intermittently. Prescriptions:  You will be provided a prescription for pain medication to take as needed.  If your pain is not severe enough to require the prescription pain medication, you may take Tylenol instead.  You should also take an over the counter stool softener (Colace 100 mg twice daily) to avoid straining with bowel movements as the pain medication may constipate you. Finally, you will also be provided a prescription for an antibiotic to begin the day prior to your return visit in the office for catheter removal. Catheter care: You will be taught how to take care of the catheter by the nursing staff prior to discharge from the hospital.  You may use both a leg bag and the larger bedside bag but it is recommended to at least use the bigger bedside bag at nighttime as the leg bag is small and will fill up overnight and also does not drain as well when lying flat. You may periodically feel a strong urge to void with the catheter in place.  This is a bladder spasm and most often can occur when having a bowel movement or when you are moving around. It is typically self-limited and usually will stop after a few minutes.  You may use some Vaseline or Neosporin around the tip of the catheter to reduce friction  at the tip of the penis. Incisions: You may remove your dressing bandages the 2nd day after surgery.  You most likely will have a few small staples in each of the incisions and once the bandages are removed, the incisions may stay open to air.  You may start showering (not soaking or bathing in water) 48 hours after surgery and the incisions simply need to be patted dry after the shower.  No additional care is needed. What to call us about: You should call the office [phone]) if you develop fever > 101, persistent vomiting, or the catheter stops draining. Also, feel free to call with any other questions you may have and remember the handout that was provided to you as a reference preoperatively which answers many of the common questions that arise after surgery. You may resume Ozempic, aspirin, advil, aleve, vitamins, and supplements 7 days after surgery.   Discontinue Flomax.

## 2023-05-25 NOTE — Transfer of Care (Cosign Needed)
Immediate Anesthesia Transfer of Care Note  Patient: Sergio Davis  Procedure(s) Performed: XI ROBOTIC ASSISTED LAPAROSCOPIC RADICAL PROSTATECTOMY LEVEL 2 BILATERAL PELVIC LYMPHADENECTOMY (Bilateral)  Patient Location: PACU  Anesthesia Type:General  Level of Consciousness: sedated  Airway & Oxygen Therapy: Patient Spontanous Breathing and Patient connected to face mask oxygen  Post-op Assessment: Report given to RN and Post -op Vital signs reviewed and stable  Post vital signs: Reviewed and stable  Last Vitals:  Vitals Value Taken Time  BP 157/76 05/25/23 1301  Temp    Pulse 91 05/25/23 1304  Resp 15 05/25/23 1304  SpO2 94 % 05/25/23 1304  Vitals shown include unfiled device data.  Last Pain:  Vitals:   05/25/23 0945  TempSrc:   PainSc: 0-No pain         Complications: No notable events documented.

## 2023-05-26 ENCOUNTER — Encounter (HOSPITAL_COMMUNITY): Payer: Self-pay | Admitting: Urology

## 2023-05-26 DIAGNOSIS — C61 Malignant neoplasm of prostate: Secondary | ICD-10-CM | POA: Diagnosis not present

## 2023-05-26 LAB — GLUCOSE, CAPILLARY
Glucose-Capillary: 109 mg/dL — ABNORMAL HIGH (ref 70–99)
Glucose-Capillary: 110 mg/dL — ABNORMAL HIGH (ref 70–99)
Glucose-Capillary: 113 mg/dL — ABNORMAL HIGH (ref 70–99)
Glucose-Capillary: 153 mg/dL — ABNORMAL HIGH (ref 70–99)

## 2023-05-26 LAB — HEMOGLOBIN AND HEMATOCRIT, BLOOD
HCT: 31.6 % — ABNORMAL LOW (ref 39.0–52.0)
Hemoglobin: 10.6 g/dL — ABNORMAL LOW (ref 13.0–17.0)

## 2023-05-26 MED ORDER — BISACODYL 10 MG RE SUPP
10.0000 mg | Freq: Once | RECTAL | Status: AC
  Administered 2023-05-26: 10 mg via RECTAL
  Filled 2023-05-26: qty 1

## 2023-05-26 MED ORDER — TRAMADOL HCL 50 MG PO TABS: 50.0000 mg | ORAL_TABLET | Freq: Four times a day (QID) | ORAL | Status: DC | PRN

## 2023-05-26 NOTE — Progress Notes (Signed)
Patient ID: Sergio Davis, male   DOB: 10-09-1960, 63 y.o.   MRN: [id number]  1 Day Post-Op Subjective: The patient is doing well.  No nausea or vomiting. Pain is adequately controlled.  Objective: Vital signs in last 24 hours: Temp:  [97.7 F (36.5 C)-99.2 F (37.3 C)] 97.8 F (36.6 C) (01/17 0410) Pulse Rate:  [64-90] 64 (01/17 0410) Resp:  [11-18] 14 (01/17 0410) BP: (111-163)/(60-99) 133/61 (01/17 0410) SpO2:  [93 %-99 %] 97 % (01/17 0410) Weight:  [94.8 kg] 94.8 kg (01/16 0945)  Intake/Output from previous day: 01/16 0701 - 01/17 0700 In: 2589.9 [I.V.:2505.6; IV Piggyback:84.3] Out: 2570 [Urine:2300; Drains:120; Blood:150] Intake/Output this shift: No intake/output data recorded.  Physical Exam:  General: Alert and oriented. CV: RRR Lungs: Clear bilaterally. GI: Soft, Nondistended. Incisions: Clean, dry, and intact Urine: Clear Extremities: Nontender, no erythema, no edema.  Lab Results: Recent Labs    05/25/23 1412 05/26/23 0533  HGB 11.8* 10.6*  HCT 35.7* 31.6*      Assessment/Plan: POD# 1 s/p robotic prostatectomy.  1) SL IVF 2) Ambulate, Incentive spirometry 3) Transition to oral pain medication 4) Dulcolax suppository 5) D/C pelvic drain 6) Plan for likely discharge later today   Sergio Davis. MD   LOS: 0 days   Sergio Davis 05/26/2023, 7:46 AM

## 2023-05-26 NOTE — TOC Transition Note (Signed)
Transition of Care [facility]) - Discharge Note   Patient Details  Name: Sergio Davis MRN: [id number] Date of Birth: Aug 10, 1960  Transition of Care [facility]) CM/SW Contact:  Lanier Clam, RN Phone Number: 05/26/2023, 11:57 AM   Clinical Narrative: d/c home      Final next level of care: Home/Self Care Barriers to Discharge: No Barriers Identified   Patient Goals and CMS Choice            Discharge Placement                       Discharge Plan and Services Additional resources added to the After Visit Summary for                                       Social Drivers of Health (SDOH) Interventions SDOH Screenings   Food Insecurity: No Food Insecurity (05/25/2023)  Housing: Low Risk  (05/25/2023)  Transportation Needs: No Transportation Needs (05/25/2023)  Utilities: Not At Risk (05/25/2023)  Alcohol Screen: Low Risk  (03/14/2023)  Depression (PHQ2-9): Low Risk  (03/14/2023)  Social Connections: Socially Integrated (05/25/2023)  Tobacco Use: Unknown (05/25/2023)     Readmission Risk Interventions     No data to display

## 2023-05-26 NOTE — Plan of Care (Signed)
  Problem: Education: Goal: Knowledge of the procedure and recovery process will improve 05/26/2023 1220 by Leonell Lobdell, Darcus Austin, RN Outcome: Adequate for Discharge 05/26/2023 1220 by Weber Monnier, Darcus Austin, RN Outcome: Adequate for Discharge   Problem: Bowel/Gastric: Goal: Gastrointestinal status for postoperative course will improve 05/26/2023 1220 by Samanvi Cuccia, Darcus Austin, RN Outcome: Adequate for Discharge 05/26/2023 1220 by Laymond Postle, Darcus Austin, RN Outcome: Adequate for Discharge   Problem: Pain Management: Goal: General experience of comfort will improve 05/26/2023 1220 by Dylann Gallier, Darcus Austin, RN Outcome: Adequate for Discharge 05/26/2023 1220 by BausDarcus Austin, RN Outcome: Adequate for Discharge   Problem: Skin Integrity: Goal: Demonstration of wound healing without infection will improve 05/26/2023 1220 by Margit Banda, RN Outcome: Adequate for Discharge 05/26/2023 1220 by Louanne Calvillo, Darcus Austin, RN Outcome: Adequate for Discharge

## 2023-05-26 NOTE — Discharge Summary (Signed)
  Date of admission: 05/25/2023  Date of discharge: 05/26/2023  Admission diagnosis: Prostate Cancer  Discharge diagnosis: Prostate Cancer  History and Physical: For full details, please see admission history and physical. Briefly, Sergio Davis is a 63 y.o. gentleman with localized prostate cancer.  After discussing management/treatment options, he elected to proceed with surgical treatment.  Hospital Course: NICKIE LOLLEY was taken to the operating room on 05/25/2023 and underwent a robotic assisted laparoscopic radical prostatectomy. He tolerated this procedure well and without complications. Postoperatively, he was able to be transferred to a regular hospital room following recovery from anesthesia.  He was able to begin ambulating the night of surgery. He remained hemodynamically stable overnight.  He had excellent urine output with appropriately minimal output from his pelvic drain and his pelvic drain was removed on POD #1.  He was transitioned to oral pain medication, tolerated a clear liquid diet, and had met all discharge criteria and was able to be discharged home later on POD#1.  Laboratory values:  Recent Labs    05/25/23 1412 05/26/23 0533  HGB 11.8* 10.6*  HCT 35.7* 31.6*    Disposition: Home  Discharge instruction: He was instructed to be ambulatory but to refrain from heavy lifting, strenuous activity, or driving. He was instructed on urethral catheter care.  Discharge medications:   Allergies as of 05/26/2023       Reactions   Ciprofloxacin Other (See Comments)   lightheaded        Medication List     STOP taking these medications    aspirin 81 MG chewable tablet   B-12 3000 MCG Caps   Coenzyme Q10 300 MG Caps   multivitamin with minerals Tabs tablet   Ozempic (0.25 or 0.5 MG/DOSE) 2 MG/3ML Sopn Generic drug: Semaglutide(0.25 or 0.5MG /DOS)   tamsulosin 0.4 MG Caps capsule Commonly known as: FLOMAX       TAKE these medications     acetaminophen 500 MG tablet Commonly known as: TYLENOL Take 1,000 mg by mouth every 6 (six) hours as needed for moderate pain (pain score 4-6).   docusate sodium 100 MG capsule Commonly known as: COLACE Take 1 capsule (100 mg total) by mouth 2 (two) times daily.   lisinopril-hydrochlorothiazide 20-25 MG tablet Commonly known as: ZESTORETIC Take 1 tablet by mouth daily.   metFORMIN 500 MG 24 hr tablet Commonly known as: GLUCOPHAGE-XR Take 500 mg by mouth 2 (two) times daily.   OneTouch Verio test strip Generic drug: glucose blood 4 (four) times daily.   rosuvastatin 10 MG tablet Commonly known as: CRESTOR Take 10 mg by mouth daily.   sulfamethoxazole-trimethoprim 800-160 MG tablet Commonly known as: BACTRIM DS Take 1 tablet by mouth 2 (two) times daily. Start the day prior to foley removal appointment   traMADol 50 MG tablet Commonly known as: Ultram Take 1-2 tablets (50-100 mg total) by mouth every 6 (six) hours as needed for moderate pain (pain score 4-6) or severe pain (pain score 7-10).        Followup: He will followup in 1 week for catheter removal and to discuss his surgical pathology results.

## 2023-05-26 NOTE — Plan of Care (Signed)
  Problem: Education: Goal: Knowledge of the procedure and recovery process will improve Outcome: Progressing   Problem: Pain Management: Goal: General experience of comfort will improve Outcome: Progressing   Problem: Urinary Elimination: Goal: Ability to avoid or minimize complications of infection will improve Outcome: Progressing

## 2023-05-29 LAB — SURGICAL PATHOLOGY

## 2023-07-17 ENCOUNTER — Ambulatory Visit: Payer: Self-pay | Admitting: Surgery

## 2023-07-17 NOTE — H&P (Signed)
 Subjective   Chief Complaint: New Consultation (Right inguinal hernia.)     History of Present Illness: Sergio Davis is a 63 y.o. male who is seen today as an office consultation at the request of Dr. Thornell Mule for evaluation of New Consultation (Right inguinal hernia.) .   This is a 63 year old male who is 2 months status post robotic prostatectomy for prostate cancer.  His surgery was performed by Dr. Laverle Patter.  Even before surgery, the patient had been diagnosed with a large right inguinal hernia.  He states that it has been present for several months.  It remains reducible.  He denies any obstructive symptoms.  He denies any pain or symptoms on the left side.  He has had some urinary incontinence since his prostate surgery.  The patient had a CT scan in 2015 incidentally noted to moderate-sized bilateral fat-containing inguinal hernias.   Review of Systems: A complete review of systems was obtained from the patient.  I have reviewed this information and discussed as appropriate with the patient.  See HPI as well for other ROS.  Review of Systems  Constitutional: Negative.   HENT: Negative.    Eyes: Negative.   Respiratory: Negative.    Cardiovascular: Negative.   Gastrointestinal: Negative.   Genitourinary: Negative.   Musculoskeletal: Negative.   Skin:  Positive for itching.  Neurological: Negative.   Endo/Heme/Allergies: Negative.   Psychiatric/Behavioral: Negative.        Medical History: Past Medical History:  Diagnosis Date   Chronic kidney disease    Diabetes mellitus without complication (CMS/HHS-HCC)    History of cancer    Hypertension     Patient Active Problem List  Diagnosis   Malignant neoplasm of prostate (CMS/HHS-HCC)   Prediabetes   High blood pressure   High cholesterol    Past Surgical History:  Procedure Laterality Date   CATARACT EXTRACTION     PROSTATE SURGERY       Allergies  Allergen Reactions   Ciprofloxacin Dizziness    Current  Outpatient Medications on File Prior to Visit  Medication Sig Dispense Refill   aspirin 81 MG EC tablet 1 tablet Orally Once a day     coenzyme Q10 (CO Q-10) 300 mg Cap      lisinopriL-hydroCHLOROthiazide (ZESTORETIC) 20-25 mg tablet Take 1 tablet by mouth once daily     metFORMIN (GLUCOPHAGE) 500 MG tablet 1 tablet with a meal Orally twice daily     multivit with min-folic acid 0.4 mg Tab 1 tablet Orally Once a day     multivit-min/folic/vit K/lycop (ONE-A-DAY MEN'S MULTIVITAMIN ORAL)      rosuvastatin 10 mg CpSP      semaglutide (OZEMPIC) 0.25 mg or 0.5 mg (2 mg/3 mL) pen injector  (Patient not taking: Reported on 07/17/2023)     No current facility-administered medications on file prior to visit.    Family History  Problem Relation Age of Onset   Stroke Father    High blood pressure (Hypertension) Father    Hyperlipidemia (Elevated cholesterol) Father    Coronary Artery Disease (Blocked arteries around heart) Father    Colon cancer Father      Social History   Tobacco Use  Smoking Status Never  Smokeless Tobacco Never     Social History   Socioeconomic History   Marital status: Married  Tobacco Use   Smoking status: Never   Smokeless tobacco: Never  Substance and Sexual Activity   Alcohol use: Not Currently   Drug use: Never  Social Drivers of Health   Food Insecurity: No Food Insecurity (05/25/2023)   Received from [facility]   Hunger Vital Sign    Worried About Running Out of Food in the Last Year: Never true    Ran Out of Food in the Last Year: Never true  Transportation Needs: No Transportation Needs (05/25/2023)   Received from [facility] - Transportation    Lack of Transportation (Medical): No    Lack of Transportation (Non-Medical): No  Social Connections: Socially Integrated (05/25/2023)   Received from [facility]   Social Connection and Isolation Panel [NHANES]    Frequency of Communication with Friends and Family: More than three times a  week    Frequency of Social Gatherings with Friends and Family: Once a week    Attends Religious Services: More than 4 times per year    Active Member of [employer] or Organizations: No    Attends Engineer, structural: 1 to 4 times per year    Marital Status: Married  Housing Stability: Unknown (07/17/2023)   Housing Stability Vital Sign    Homeless in the Last Year: No    Objective:    Vitals:   07/17/23 1100  BP: 125/75  Pulse: 65  Temp: 36.1 C (97 F)  SpO2: 97%  Weight: 92.1 kg (203 lb)  Height: 185.4 cm (6\' 1" )    Body mass index is 26.78 kg/m.  Physical Exam   Constitutional:  WDWN in NAD, conversant, no obvious deformities; lying in bed comfortably Eyes:  Pupils equal, round; sclera anicteric; moist conjunctiva; no lid lag HENT:  Oral mucosa moist; good dentition  Neck:  No masses palpated, trachea midline; no thyromegaly Lungs:  CTA bilaterally; normal respiratory effort CV:  Regular rate and rhythm; no murmurs; extremities well-perfused with no edema Abd:  +bowel sounds, soft, non-tender, no palpable organomegaly; no palpable hernias.  Healed laparoscopic incisions. GU: Bilateral stented testes, no testicular masses.  Large right inguinal hernia that is reducible when he is supine.  Smaller left inguinal hernia that is spontaneously reducible. Musc: Normal gait; no apparent clubbing or cyanosis in extremities Lymphatic:  No palpable cervical or axillary lymphadenopathy Skin:  Warm, dry; no sign of jaundice Psychiatric - alert and oriented x 4; calm mood and affect   Labs, Imaging and Diagnostic Testing: CLINICAL DATA:  Acute onset LEFT flank pain beginning today.   EXAM: CT ABDOMEN AND PELVIS WITHOUT CONTRAST   TECHNIQUE: Multidetector CT imaging of the abdomen and pelvis was performed following the standard protocol without IV contrast.   COMPARISON:  None.   FINDINGS: LUNG BASES: Included view of the lung bases are clear. The visualized heart and  pericardium are unremarkable.   KIDNEYS/BLADDER: Kidneys are orthotopic, demonstrating normal size and morphology. Mild RIGHT hydroureteronephrosis to the level of the proximal ureter where a 5 mm calculus is seen. Residual 3 mm RIGHT lower pole, 4 mm RIGHT lower pole, 3 mm RIGHT lower pole nephrolithiasis. Punctate LEFT lower pole nephrolithiasis without hydronephrosis. The urinary bladder is partially distended, harboring no intravesicular calculi.   SOLID ORGANS: The spleen, gallbladder, pancreas and adrenal glands are unremarkable for this non-contrast examination. Mild hepatic steatosis.   GASTROINTESTINAL TRACT: The stomach, small and large bowel are normal in course and caliber without inflammatory changes, the sensitivity may be decreased by lack of enteric contrast. Mild sigmoid diverticulosis. Subcentimeter focal outpouching at the cecum may reflect diverticulum or appendix without inflammation.   PERITONEUM/RETROPERITONEUM: No intraperitoneal free fluid  nor free air. Aortoiliac vessels are normal in course and caliber, mild calcific atherosclerosis. No lymphadenopathy by CT size criteria. Prostate is 5.3 x 4.4 cm, deforming the base of the urinary bladder.   SOFT TISSUES/ OSSEOUS STRUCTURES: Nonsuspicious. Moderate sacroiliac osteoarthrosis. Moderate bilateral fat containing inguinal hernias.   IMPRESSION: Mild RIGHT hydroureteronephrosis to the level the proximal ureter where a 5 mm calculus is seen.   Bilateral nephrolithiasis measuring up to 4 mm. No LEFT obstructive uropathy.     Electronically Signed   By: Awilda Metro   On: 04/01/2014 23:41    Assessment and Plan:  Diagnoses and all orders for this visit:  Non-recurrent bilateral inguinal hernia without obstruction or gangrene    Open bilateral inguinal hernia repairs with mesh.  The surgical procedure has been discussed with the patient.  Potential risks, benefits, alternative treatments, and  expected outcomes have been explained.  All of the patient's questions at this time have been answered.  The likelihood of reaching the patient's treatment goal is good.  The patient understands the proposed surgical procedure and wishes to proceed.    Lissa Morales, MD  07/17/2023 12:02 PM
